# Patient Record
Sex: Female | Born: 2000 | Race: Black or African American | Hispanic: No | Marital: Single | Smoking: Never smoker
Health system: Southern US, Community
[De-identification: ages and names within clinical notes are randomized; demographics above are authoritative.]

## PROBLEM LIST (undated history)

## (undated) DIAGNOSIS — F4310 Post-traumatic stress disorder, unspecified: Secondary | ICD-10-CM

## (undated) DIAGNOSIS — J309 Allergic rhinitis, unspecified: Secondary | ICD-10-CM

## (undated) DIAGNOSIS — L309 Dermatitis, unspecified: Secondary | ICD-10-CM

## (undated) DIAGNOSIS — Z803 Family history of malignant neoplasm of breast: Secondary | ICD-10-CM

## (undated) DIAGNOSIS — G4700 Insomnia, unspecified: Secondary | ICD-10-CM

## (undated) DIAGNOSIS — M8430XA Stress fracture, unspecified site, initial encounter for fracture: Secondary | ICD-10-CM

## (undated) DIAGNOSIS — F32A Depression, unspecified: Secondary | ICD-10-CM

## (undated) DIAGNOSIS — G4730 Sleep apnea, unspecified: Secondary | ICD-10-CM

## (undated) DIAGNOSIS — Z800 Family history of malignant neoplasm of digestive organs: Secondary | ICD-10-CM

## (undated) DIAGNOSIS — F329 Major depressive disorder, single episode, unspecified: Secondary | ICD-10-CM

## (undated) HISTORY — DX: Allergic rhinitis, unspecified: J30.9

## (undated) HISTORY — PX: WISDOM TOOTH EXTRACTION: SHX21

## (undated) HISTORY — DX: Family history of malignant neoplasm of digestive organs: Z80.0

## (undated) HISTORY — DX: Dermatitis, unspecified: L30.9

## (undated) HISTORY — DX: Family history of malignant neoplasm of breast: Z80.3

---
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
## 2018-07-30 ENCOUNTER — Encounter (HOSPITAL_COMMUNITY): Payer: Self-pay | Admitting: *Deleted

## 2018-07-30 ENCOUNTER — Ambulatory Visit (HOSPITAL_COMMUNITY)
Admission: EM | Admit: 2018-07-30 | Discharge: 2018-07-30 | Disposition: A | Discharge status: Home / Self Care | Attending: Family Medicine | Admitting: Family Medicine

## 2018-07-30 ENCOUNTER — Other Ambulatory Visit: Payer: Self-pay

## 2018-07-30 DIAGNOSIS — B349 Viral infection, unspecified: Secondary | ICD-10-CM | POA: Diagnosis not present

## 2018-07-30 HISTORY — DX: Depression, unspecified: F32.A

## 2018-07-30 HISTORY — DX: Insomnia, unspecified: G47.00

## 2018-07-30 HISTORY — DX: Stress fracture, unspecified site, initial encounter for fracture: M84.30XA

## 2018-07-30 HISTORY — DX: Major depressive disorder, single episode, unspecified: F32.9

## 2018-07-30 HISTORY — DX: Post-traumatic stress disorder, unspecified: F43.10

## 2018-07-30 MED ORDER — DICLOFENAC SODIUM 75 MG PO TBEC: 75.0000 mg | DELAYED_RELEASE_TABLET | Freq: Two times a day (BID) | ORAL | 0 refills | Status: DC

## 2018-07-30 MED ORDER — ONDANSETRON 8 MG PO TBDP: 8.0000 mg | ORAL_TABLET | Freq: Three times a day (TID) | ORAL | 0 refills | Status: DC | PRN

## 2018-07-30 NOTE — ED Provider Notes (Signed)
[facility]    CSN: [id number] Arrival date & time: 07/30/18  1722     History   Chief Complaint Chief Complaint  Patient presents with  . Generalized Body Aches  . Nausea    HPI Yvette Caldwell is a 17 y.o. female.   This is the [facility] for this 17 year old woman.  She complains of 4 days of nausea, dizziness, and body aches without fever or vomiting.  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  She flew when several days ago and is going back this coming Saturday.  Patient has a long history of intermittent dizziness.  She is never had a good diagnosis for this.  Patient has no recent vomiting or fever.     Past Medical History:  Diagnosis Date  . Depression   . Insomnia   . PTSD (post-traumatic stress disorder)   . Stress fracture     There are no active problems to display for this patient.   Past Surgical History:  Procedure Laterality Date  . WISDOM TOOTH EXTRACTION      OB History   No obstetric history on file.      Home Medications    Prior to Admission medications   Medication Sig Start Date End Date Taking? Authorizing Provider  Fexofenadine HCl (ALLEGRA PO) Take by mouth.   Yes [provider]  diclofenac (VOLTAREN) 75 MG EC tablet Take 1 tablet (75 mg total) by mouth 2 (two) times daily. 07/30/18   Elvina SidleLauenstein, Justice Aguirre, MD  ondansetron (ZOFRAN-ODT) 8 MG disintegrating tablet Take 1 tablet (8 mg total) by mouth every 8 (eight) hours as needed for nausea. 07/30/18   Elvina SidleLauenstein, Molly Maselli, MD    Family History Family History  Problem Relation Age of Onset  . Pancreatitis Mother     Social History Social History   Tobacco Use  . Smoking status: Never Smoker  . Smokeless tobacco: Never Used  Substance Use Topics  . Alcohol use: Never    Frequency: Never  . Drug use: Not on file     Allergies   Patient has no known allergies.   Review of  Systems Review of Systems   Physical Exam Triage Vital Signs ED Triage Vitals  Enc Vitals Group     BP 07/30/18 1814 123/63     Pulse Rate 07/30/18 1813 78     Resp 07/30/18 1813 18     Temp 07/30/18 1813 98.6 F (37 C)     Temp Source 07/30/18 1813 Oral     SpO2 07/30/18 1813 100 %     Weight --      Height --      Head Circumference --      Peak Flow --      Pain Score 07/30/18 1813 5     Pain Loc --      Pain Edu? --      Excl. in GC? --    No data found.  Updated Vital Signs BP 123/63   Pulse 78   Temp 98.6 F (37 C) (Oral)   Resp 18   LMP 07/28/2018 (Approximate)   SpO2 100%    Physical Exam Vitals signs and nursing note reviewed.  Constitutional:      General: She is not in acute distress.    Appearance: Normal appearance. She is not ill-appearing, toxic-appearing or diaphoretic.  HENT:     Head: Normocephalic  and atraumatic.     Right Ear: Tympanic membrane, ear canal and external ear normal.     Left Ear: Tympanic membrane, ear canal and external ear normal.     Nose: Nose normal.     Mouth/Throat:     Mouth: Mucous membranes are moist.     Pharynx: Oropharynx is clear.  Eyes:     Conjunctiva/sclera: Conjunctivae normal.     Pupils: Pupils are equal, round, and reactive to light.  Neck:     Musculoskeletal: Normal range of motion and neck supple.  Cardiovascular:     Rate and Rhythm: Normal rate.     Heart sounds: Normal heart sounds.  Pulmonary:     Effort: Pulmonary effort is normal.     Breath sounds: Normal breath sounds.  Musculoskeletal: Normal range of motion.  Skin:    General: Skin is warm and dry.  Neurological:     General: No focal deficit present.     Mental Status: She is alert and oriented to person, place, and time.  Psychiatric:        Mood and Affect: Mood normal.        Thought Content: Thought content normal.      UC Treatments / Results  Labs (all labs ordered are listed, but only abnormal results are  displayed) Labs Reviewed - No data to display  EKG None  Radiology No results found.  Procedures Procedures (including critical care time)  Medications Ordered in UC Medications - No data to display  Initial Impression / Assessment and Plan / UC Course  I have reviewed the triage vital signs and the nursing notes.  Pertinent labs & imaging results that were available during my care of the patient were reviewed by me and considered in my medical decision making (see chart for details).    Final Clinical Impressions(s) / UC Diagnoses   Final diagnoses:  Viral illness   Discharge Instructions   None    ED Prescriptions    Medication Sig Dispense Auth. Provider   ondansetron (ZOFRAN-ODT) 8 MG disintegrating tablet Take 1 tablet (8 mg total) by mouth every 8 (eight) hours as needed for nausea. 15 tablet Elvina SidleLauenstein, Zhanae Proffit, MD   diclofenac (VOLTAREN) 75 MG EC tablet Take 1 tablet (75 mg total) by mouth 2 (two) times daily. 14 tablet Elvina SidleLauenstein, Toi Stelly, MD     Controlled Substance Prescriptions [place] Controlled Substance Registry consulted? Not Applicable   Elvina SidleLauenstein, Keeara Frees, MD 07/30/18 [phone]

## 2018-07-30 NOTE — ED Triage Notes (Signed)
C/o generalized body aches, nausea, dizziness x 4 days without fever or vomiting.

## 2020-01-22 ENCOUNTER — Encounter (HOSPITAL_COMMUNITY): Payer: Self-pay

## 2020-01-22 ENCOUNTER — Emergency Department (HOSPITAL_COMMUNITY)
Admission: EM | Admit: 2020-01-22 | Discharge: 2020-01-23 | Disposition: A | Discharge status: Home / Self Care | Payer: Self-pay | Attending: Emergency Medicine | Admitting: Emergency Medicine

## 2020-01-22 DIAGNOSIS — F419 Anxiety disorder, unspecified: Secondary | ICD-10-CM | POA: Insufficient documentation

## 2020-01-22 DIAGNOSIS — Z8616 Personal history of COVID-19: Secondary | ICD-10-CM | POA: Insufficient documentation

## 2020-01-22 DIAGNOSIS — U071 COVID-19: Secondary | ICD-10-CM

## 2020-01-22 DIAGNOSIS — R197 Diarrhea, unspecified: Secondary | ICD-10-CM | POA: Insufficient documentation

## 2020-01-22 NOTE — ED Notes (Signed)
Dinita, mother, [phone] would like a callback when in a room

## 2020-01-22 NOTE — ED Triage Notes (Signed)
Pt states that she has been having an anxiety attack since Sunday, has taken her home meds with out relief. Denies SI/HI/AVH. C/o of some nausea. Covid + on 6/11

## 2020-01-23 ENCOUNTER — Emergency Department (HOSPITAL_COMMUNITY): Payer: Self-pay

## 2020-01-23 LAB — URINALYSIS, ROUTINE W REFLEX MICROSCOPIC
Bacteria, UA: NONE SEEN
Bilirubin Urine: NEGATIVE
Glucose, UA: NEGATIVE mg/dL
Ketones, ur: NEGATIVE mg/dL
Leukocytes,Ua: NEGATIVE
Nitrite: NEGATIVE
Protein, ur: NEGATIVE mg/dL
Specific Gravity, Urine: 1.026 (ref 1.005–1.030)
pH: 5 (ref 5.0–8.0)

## 2020-01-23 LAB — COMPREHENSIVE METABOLIC PANEL
ALT: 60 U/L — ABNORMAL HIGH (ref 0–44)
AST: 67 U/L — ABNORMAL HIGH (ref 15–41)
Albumin: 3.7 g/dL (ref 3.5–5.0)
Alkaline Phosphatase: 69 U/L (ref 38–126)
Anion gap: 11 (ref 5–15)
BUN: 12 mg/dL (ref 6–20)
CO2: 22 mmol/L (ref 22–32)
Calcium: 9.2 mg/dL (ref 8.9–10.3)
Chloride: 106 mmol/L (ref 98–111)
Creatinine, Ser: 0.78 mg/dL (ref 0.44–1.00)
GFR calc Af Amer: >60 mL/min (ref >60)
GFR calc non Af Amer: >60 mL/min (ref >60)
Glucose, Bld: 93 mg/dL (ref 70–99)
Potassium: 3.9 mmol/L (ref 3.5–5.1)
Sodium: 139 mmol/L (ref 135–145)
Total Bilirubin: 0.5 mg/dL (ref 0.3–1.2)
Total Protein: 7.4 g/dL (ref 6.5–8.1)

## 2020-01-23 LAB — CBC WITH DIFFERENTIAL/PLATELET
Abs Immature Granulocytes: 0.01 10*3/uL (ref 0.00–0.07)
Basophils Absolute: 0 10*3/uL (ref 0.0–0.1)
Basophils Relative: 0 %
Eosinophils Absolute: 0 10*3/uL (ref 0.0–0.5)
Eosinophils Relative: 1 %
HCT: 42.7 % (ref 36.0–46.0)
Hemoglobin: 13.9 g/dL (ref 12.0–15.0)
Immature Granulocytes: 0 %
Lymphocytes Relative: 68 %
Lymphs Abs: 2.9 10*3/uL (ref 0.7–4.0)
MCH: 28 pg (ref 26.0–34.0)
MCHC: 32.6 g/dL (ref 30.0–36.0)
MCV: 86.1 fL (ref 80.0–100.0)
Monocytes Absolute: 0.3 10*3/uL (ref 0.1–1.0)
Monocytes Relative: 8 %
Neutro Abs: 1 10*3/uL — ABNORMAL LOW (ref 1.7–7.7)
Neutrophils Relative %: 23 %
Platelets: 127 10*3/uL — ABNORMAL LOW (ref 150–400)
RBC: 4.96 MIL/uL (ref 3.87–5.11)
RDW: 13.2 % (ref 11.5–15.5)
WBC: 4.3 10*3/uL (ref 4.0–10.5)
nRBC: 0 % (ref 0.0–0.2)

## 2020-01-23 LAB — LIPASE, BLOOD: Lipase: 27 U/L (ref 11–51)

## 2020-01-23 LAB — I-STAT BETA HCG BLOOD, ED (MC, WL, AP ONLY): I-stat hCG, quantitative: <5 m[IU]/mL (ref <5)

## 2020-01-23 LAB — TROPONIN I (HIGH SENSITIVITY): Troponin I (High Sensitivity): <2 ng/L (ref <18)

## 2020-01-23 MED ORDER — LORAZEPAM 2 MG/ML IJ SOLN
1.0000 mg | Freq: Once | INTRAMUSCULAR | Status: AC
  Administered 2020-01-23: 1 mg via INTRAVENOUS
  Filled 2020-01-23: qty 1

## 2020-01-23 MED ORDER — ONDANSETRON 4 MG PO TBDP
4.0000 mg | ORAL_TABLET | Freq: Three times a day (TID) | ORAL | 0 refills | Status: AC | PRN
Start: 2020-01-23 — End: ?

## 2020-01-23 MED ORDER — SODIUM CHLORIDE 0.9 % IV BOLUS
1000.0000 mL | Freq: Once | INTRAVENOUS | Status: AC
  Administered 2020-01-23: 1000 mL via INTRAVENOUS

## 2020-01-23 NOTE — Discharge Instructions (Addendum)
At this time there does not appear to be the presence of an emergent medical condition, however there is always the potential for conditions to change. Please read and follow the below instructions.  Please return to the Emergency Department immediately for any new or worsening symptoms or if your symptoms do not improve within 3 days. Please be sure to follow up with your Primary Care Provider within one week regarding your visit today; please call their office to schedule an appointment even if you are feeling better for a follow-up visit. You may use the nausea medication Zofran as prescribed to help with nausea. Zofran will dissolve under your tongue so you do not have to swallow it. Please be sure to drink plenty of water to avoid dehydration and get plenty of rest. Your liver function tests, ALT and AST were slightly elevated today. Have your laboratory tests rechecked by your primary care doctor next week.  Get help right away if: You have chest pain. You feel very weak or you pass out (faint). You have bloody or black poop or poop that looks like tar. You have very bad pain, cramping, or bloating in your belly (abdomen). You have trouble breathing or you are breathing very quickly. Your heart is beating very quickly. Your skin feels cold and clammy. You feel confused. You have signs of losing too much water in your body, such as: Dark pee, very little pee, or no pee. Cracked lips. Dry mouth. Sunken eyes. Sleepiness. Weakness. You have trouble breathing. You have pain or pressure in your chest. You have confusion. You have bluish lips and fingernails. You have difficulty waking from sleep. You have any new/concerning or worsening of symptoms  If you ever feel like you may hurt yourself or others, or have thoughts about taking your own life, get help right away. You can go to your nearest emergency department or call: Your local emergency services (911 in the [place] A suicide  crisis helpline, such as the National Suicide Prevention Lifeline at (858)128-0572. This is open 24 hours a day.  Please read the additional information packets attached to your discharge summary.  Do not take your medicine if  develop an itchy rash, swelling in your mouth or lips, or difficulty breathing; call 911 and seek immediate emergency medical attention if this occurs.  Note: Portions of this text may have been transcribed using voice recognition software. Every effort was made to ensure accuracy; however, inadvertent computerized transcription errors may still be present.

## 2020-01-23 NOTE — ED Provider Notes (Signed)
[facility] EMERGENCY DEPARTMENT Provider Note   CSN: [id number] Arrival date & time: 01/22/20  2013     History Chief Complaint  Patient presents with  . Panic Attack    Yvette Caldwell is a 19 y.o. female history of PTSD, anxiety/depression.  Patient reports of the last 3 days she has been experiencing a GI illness which she describes as "the stomach bug". She reports intermittent abdominal cramping associated with nonbloody diarrhea multiple times a day for the past 3 days. Abdominal cramping sensation is only when she has diarrhea improves after she stops using the bathroom, generalized mild intermittent. Patient feels that her abdominal cramping shot up to her chest briefly 1 time yesterday and has not reoccurred. Associated with nausea.  Patient reports that her diarrheal illness has caused her to have increased anxiety over the past few days. She has been taking her hydroxyzine at home with minimal relief. She also takes Zoloft in the morning without relief. Patient reports that hospitals are a "trigger" for her and coming in today has caused her increased anxiety.  Of note patient reports she was diagnosed with COVID-19 viral infection on January 11, 2020.  She denies fever/chills, headache, neck pain, sore throat, shortness of breath, cough/hemoptysis, vomiting, dysuria/hematuria, vaginal discharge/concern for STI, fall/injury, extremity swelling/color change, drug/alcohol use, SI/HI, hallucinations or any additional concerns.  HPI     Past Medical History:  Diagnosis Date  . Depression   . Insomnia   . PTSD (post-traumatic stress disorder)   . Stress fracture     There are no problems to display for this patient.   Past Surgical History:  Procedure Laterality Date  . WISDOM TOOTH EXTRACTION       OB History   No obstetric history on file.     Family History  Problem Relation Age of Onset  . Pancreatitis Mother     Social History    Tobacco Use  . Smoking status: Never Smoker  . Smokeless tobacco: Never Used  Vaping Use  . Vaping Use: Never used  Substance Use Topics  . Alcohol use: Never  . Drug use: Not on file    Home Medications Prior to Admission medications   Medication Sig Start Date End Date Taking? Authorizing Provider  diclofenac (VOLTAREN) 75 MG EC tablet Take 1 tablet (75 mg total) by mouth 2 (two) times daily. 07/30/18   Robyn Haber, MD  Fexofenadine HCl (ALLEGRA PO) Take by mouth.    [provider]  ondansetron (ZOFRAN-ODT) 8 MG disintegrating tablet Take 1 tablet (8 mg total) by mouth every 8 (eight) hours as needed for nausea. 07/30/18   Robyn Haber, MD    Allergies    Patient has no known allergies.  Review of Systems   Review of Systems Ten systems are reviewed and are negative for acute change except as noted in the HPI   Physical Exam Updated Vital Signs BP 126/77 (BP Location: Left Arm)   Pulse 98   Temp 98.2 F (36.8 C) (Oral)   Resp 16   Ht 5\' 8"  (1.727 m)   Wt 70.3 kg   LMP 01/01/2020   SpO2 98%   BMI 23.57 kg/m   Physical Exam Constitutional:      General: She is not in acute distress.    Appearance: Normal appearance. She is well-developed. She is not ill-appearing or diaphoretic.  HENT:     Head: Normocephalic and atraumatic.  Eyes:     General: Vision grossly  intact. Gaze aligned appropriately.     Pupils: Pupils are equal, round, and reactive to light.  Neck:     Trachea: Trachea and phonation normal.  Cardiovascular:     Rate and Rhythm: Normal rate and regular rhythm.  Pulmonary:     Effort: Pulmonary effort is normal. No respiratory distress.  Abdominal:     General: There is no distension.     Palpations: Abdomen is soft.     Tenderness: There is no abdominal tenderness. There is no guarding or rebound.  Genitourinary:    Comments: Pelvic examination refused by patient Musculoskeletal:        General: Normal range of motion.      Cervical back: Normal range of motion.  Skin:    General: Skin is warm and dry.  Neurological:     Mental Status: She is alert.     GCS: GCS eye subscore is 4. GCS verbal subscore is 5. GCS motor subscore is 6.     Comments: Speech is clear and goal oriented, follows commands Major Cranial nerves without deficit, no facial droop Moves extremities without ataxia, coordination intact  Psychiatric:        Behavior: Behavior normal.     ED Results / Procedures / Treatments   Labs (all labs ordered are listed, but only abnormal results are displayed) Labs Reviewed  CBC WITH DIFFERENTIAL/PLATELET  COMPREHENSIVE METABOLIC PANEL  LIPASE, BLOOD  URINALYSIS, ROUTINE W REFLEX MICROSCOPIC  I-STAT BETA HCG BLOOD, ED ([facility], [facility], AP ONLY)  TROPONIN I (HIGH SENSITIVITY)    EKG EKG Interpretation  Date/Time:  Wednesday January 23 2020 02:16:17 EDT Ventricular Rate:  69 PR Interval:    QRS Duration: 74 QT Interval:  368 QTC Calculation: 395 R Axis:   66 Text Interpretation: Sinus rhythm RSR' in V1 or V2, probably normal variant Confirmed by Ross Marcus ([id number]) on 01/23/2020 4:29:55 AM   Radiology No results found.  Procedures Procedures (including critical care time)  Medications Ordered in ED Medications  LORazepam (ATIVAN) injection 1 mg (has no administration in time range)  sodium chloride 0.9 % bolus 1,000 mL (has no administration in time range)    ED Course  I have reviewed the triage vital signs and the nursing notes.  Pertinent labs & imaging results that were available during my care of the patient were reviewed by me and considered in my medical decision making (see chart for details).  Yvette Caldwell was evaluated in Emergency Department on 01/23/2020 for the symptoms described in the history of present illness. She was evaluated in the context of the global COVID-19 pandemic, which necessitated consideration that the patient might be at risk for infection with the  SARS-CoV-2 virus that causes COVID-19. Institutional protocols and algorithms that pertain to the evaluation of patients at risk for COVID-19 are in a state of rapid change based on information released by regulatory bodies including the CDC and federal and state organizations. These policies and algorithms were followed during the patient's care in the ED.    MDM Rules/Calculators/A&P                          Additional History Obtained: 1. Nursing notes from this visit. 2. EMR system reviewed patient had positive Covid test on 01/15/2020 at [facility]. -----  I ordered, reviewed and interpreted labs which include: CBC shows no leukocytosis to suggest infection and no evidence of anemia. Lipase within normal limits doubt pancreatitis. CMP shows  mild elevation of AST and ALT at 67 and 68 respectively.  No emergent electrolyte derangement, evidence of acute kidney injury or gap. Pregnancy test negative. High-sensitivity troponin negative, with brief episode of pain yesterday no indication for a delta troponin. Urinalysis shows hemoglobin consistent with active patient's menstrual cycle, no evidence of infection.  EKG: Sinus rhythm RSR' in V1 or V2, probably normal variant Confirmed by Ross Marcus ([id number]) on 01/23/2020 4:29:55 AM   Chest x-ray:  IMPRESSION:  No acute chest findings. Other than mild scoliosis, negative  portable AP view of the chest.  I personally reviewed patient's chest x-ray and agree with radiologist interpretation above. - Patient received 1 L normal saline fluid bolus and 1 mg Ativan.  Anxiety resolved.  Patient has no SI/HI hallucinations no indication for TTS evaluation.  She has home anxiety medications that she will continue taking and see her primary care provider.  Patient's brief episode of chest pain yesterday in the setting of an anxiety, suspect this as etiology of her symptoms.  Doubt ACS, PE, dissection or other emergent cardiopulmonary etiology of her symptoms  yesterday.  GI symptoms are likely secondary to her gastroenteritis suspect this to be viral in nature possibly due to known COVID-19 infection.  No indication for antibiotics at this time.  On reevaluation her abdomen is soft and nontender, doubt appendicitis, SBO, cholecystitis or other emergent pathologies, do not feel any indication for imaging of the abdomen/pelvis is indicated at this time.  Will encourage continued p.o. intake and give ODT Zofran for nausea control.  Patient will see her PCP for follow-up. Patient is well-appearing no acute distress vital signs stable and requesting discharge.  At this time there does not appear to be any evidence of an acute emergency medical condition and the patient appears stable for discharge with appropriate outpatient follow up. Diagnosis was discussed with patient who verbalizes understanding of care plan and is agreeable to discharge. I have discussed return precautions with patient who verbalizes understanding. Patient encouraged to follow-up with their PCP. All questions answered.  Patient's case discussed with Dr. Wilkie Aye who agrees with plan to discharge with follow-up.   Note: Portions of this report may have been transcribed using voice recognition software. Every effort was made to ensure accuracy; however, inadvertent computerized transcription errors may still be present. Final Clinical Impression(s) / ED Diagnoses Final diagnoses:  Diarrhea, unspecified type  COVID-19  Anxiety    Rx / DC Orders ED Discharge Orders         Ordered    ondansetron (ZOFRAN ODT) 4 MG disintegrating tablet  Every 8 hours PRN     Discontinue  Reprint     01/23/20 0457           Bill Salinas, PA-C 01/23/20 0459    Shon Baton, MD 01/23/20 [phone]

## 2021-03-20 IMAGING — DX DG CHEST 1V PORT
1 series · 1 of 1 positions shown · non-contrast
Comparison: Radiograph 04/29/2016

CLINICAL DATA: Chest pain.

EXAM:
PORTABLE CHEST 1 VIEW

[chest ap]
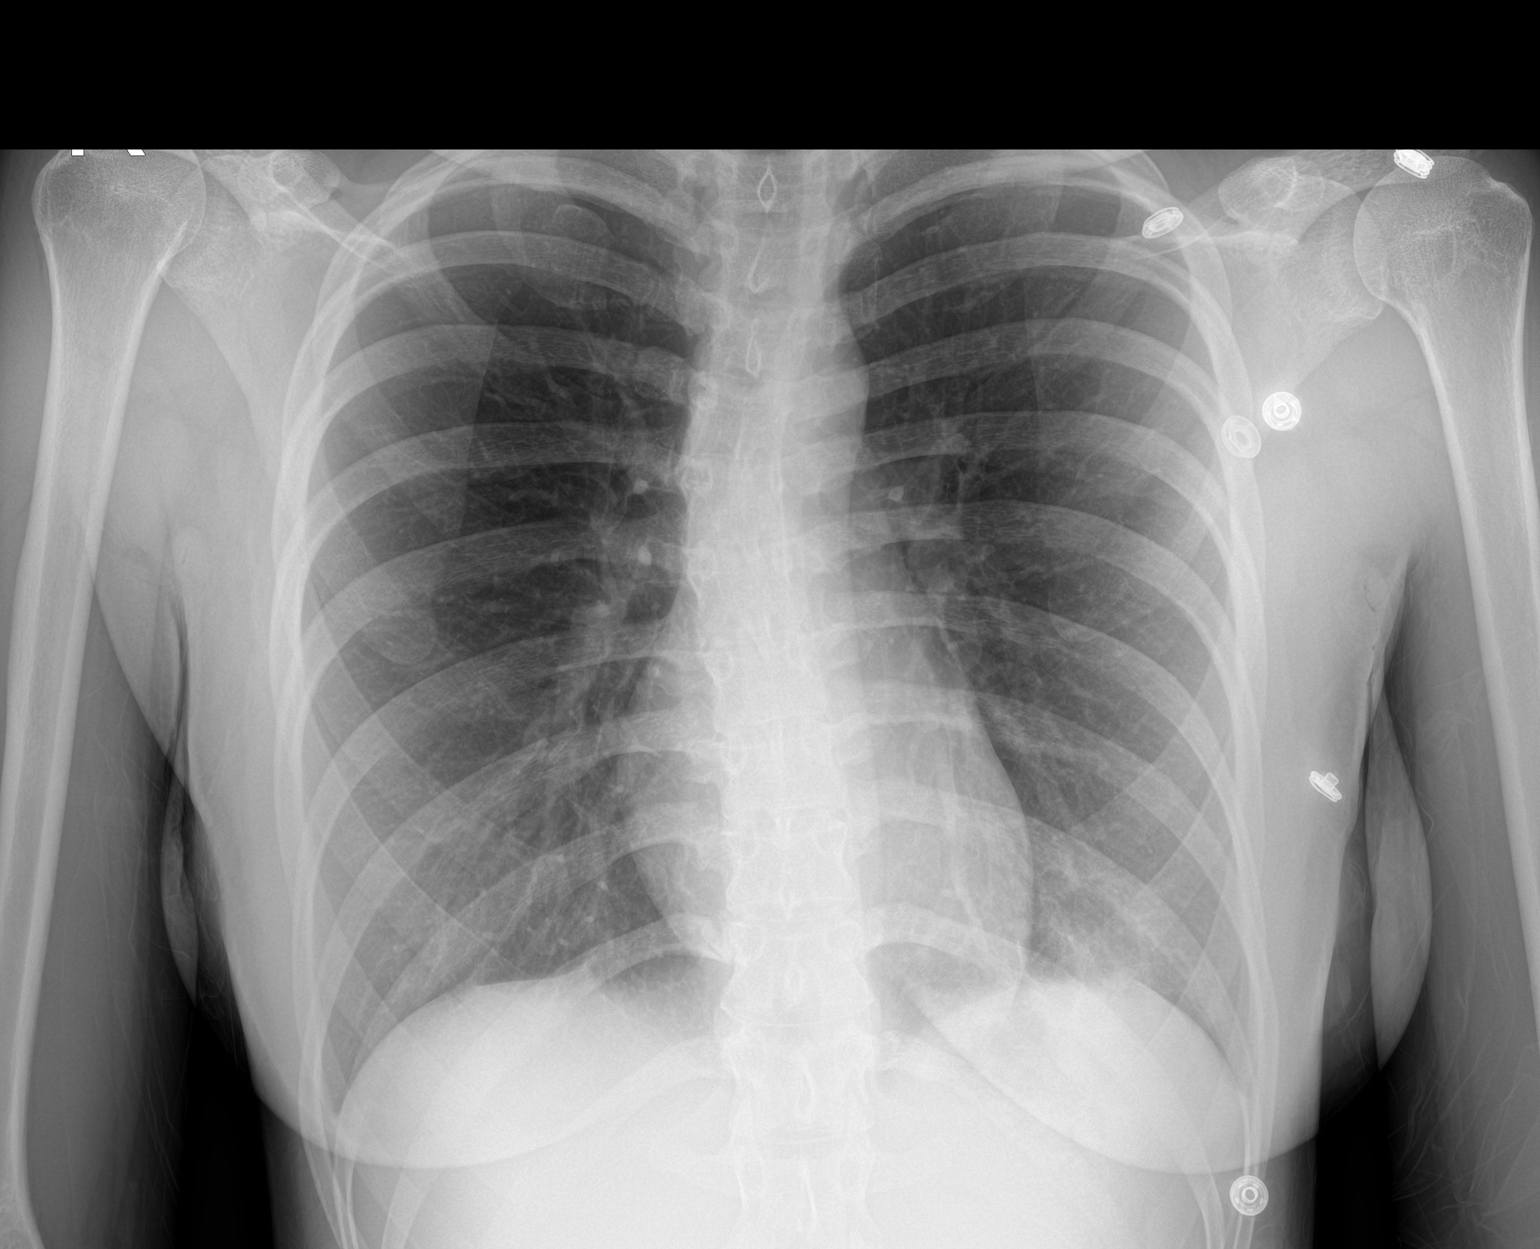

[1 of 1 positions shown; findings below may reference images not displayed]

FINDINGS: The cardiomediastinal contours are normal. The lungs are clear.
Pulmonary vasculature is normal. No consolidation, pleural effusion,
or pneumothorax. Similar mild scoliotic curvature of the upper
thoracic spine. No acute osseous abnormalities are seen.
IMPRESSION: No acute chest findings. Other than mild scoliosis, negative
portable AP view of the chest.

## 2021-12-14 ENCOUNTER — Telehealth: Payer: Self-pay | Admitting: Genetic Counselor

## 2021-12-14 NOTE — Telephone Encounter (Signed)
Scheduled appt per 5/15 referral. Pt is aware of appt date and time. Pt is aware to arrive 15 mins prior to appt time and to bring and updated insurance card. Pt is aware of appt location.   

## 2022-01-11 ENCOUNTER — Other Ambulatory Visit: Payer: Self-pay | Admitting: Genetic Counselor

## 2022-01-11 DIAGNOSIS — Z803 Family history of malignant neoplasm of breast: Secondary | ICD-10-CM

## 2022-01-13 ENCOUNTER — Inpatient Hospital Stay: Payer: Self-pay

## 2022-01-13 ENCOUNTER — Other Ambulatory Visit: Payer: Self-pay

## 2022-01-13 ENCOUNTER — Inpatient Hospital Stay: Payer: Self-pay | Attending: Genetic Counselor | Admitting: Genetic Counselor

## 2022-01-13 DIAGNOSIS — Z800 Family history of malignant neoplasm of digestive organs: Secondary | ICD-10-CM

## 2022-01-13 DIAGNOSIS — Z803 Family history of malignant neoplasm of breast: Secondary | ICD-10-CM

## 2022-01-13 LAB — GENETIC SCREENING ORDER

## 2022-01-14 ENCOUNTER — Encounter: Payer: Self-pay | Admitting: Genetic Counselor

## 2022-01-14 DIAGNOSIS — Z800 Family history of malignant neoplasm of digestive organs: Secondary | ICD-10-CM | POA: Insufficient documentation

## 2022-01-14 DIAGNOSIS — Z803 Family history of malignant neoplasm of breast: Secondary | ICD-10-CM | POA: Insufficient documentation

## 2022-01-14 NOTE — Progress Notes (Signed)
REFERRING PROVIDER: [facility], [address],  [place] [address]  PRIMARY PROVIDER:  [facility], [facility]  PRIMARY REASON FOR VISIT:  1. Family history of breast cancer   2. Family history of colon cancer      HISTORY OF PRESENT ILLNESS:   Yvette Caldwell, a 21 y.o. female, was seen for a [place] cancer genetics consultation at the request of the [facility] due to a family history of cancer.  Yvette Caldwell presents to clinic today to discuss the possibility of a hereditary predisposition to cancer, genetic testing, and to further clarify her future cancer risks, as well as potential cancer risks for family members.   Yvette Caldwell is a 21 y.o. female with no personal history of cancer.  She has a family history of breast, colon and prostate cancer  CANCER HISTORY:  Oncology History   No history exists.     RISK FACTORS:  Menarche was at age 67.  First live birth at age N/A.  OCP use for approximately 0 years.  Ovaries intact: yes.  Hysterectomy: no.  Menopausal status: premenopausal.  HRT use: 0 years. Colonoscopy: no; not examined. Mammogram within the last year: no. Number of breast biopsies: 0. Up to date with pelvic exams: yes. Any excessive radiation exposure in the past: no  Past Medical History:  Diagnosis Date   Depression    Family history of breast cancer    Family history of breast cancer    Family history of colon cancer    Insomnia    PTSD (post-traumatic stress disorder)    Stress fracture     Past Surgical History:  Procedure Laterality Date   WISDOM TOOTH EXTRACTION      Social History   Socioeconomic History   Marital status: Single    Spouse name: Not on file   Number of children: Not on file   Years of education: Not on file   Highest education level: Not on file  Occupational History   Not on file  Tobacco Use   Smoking status: Never   Smokeless tobacco: Never  Vaping Use   Vaping Use: Never used   Substance and Sexual Activity   Alcohol use: Never   Drug use: Not on file   Sexual activity: Not Currently  Other Topics Concern   Not on file  Social History Narrative   Not on file   Social Determinants of Health   Financial Resource Strain: Not on file  Food Insecurity: Not on file  Transportation Needs: Not on file  Physical Activity: Not on file  Stress: Not on file  Social Connections: Not on file     FAMILY HISTORY:  We obtained a detailed, 4-generation family history.  Significant diagnoses are listed below: Family History  Problem Relation Age of Onset   Pancreatitis Mother    Prolactinoma Mother    Cancer Paternal Aunt        NOS   Colon cancer Maternal Grandmother 106   Cancer Paternal Grandmother        NOS   Breast cancer Other 53   Prolactinoma Other    Prostate cancer Other        MGFs brother   Colon cancer Maternal Great-grandmother        MGF's mother      The patient does not have children.  She has two maternal half sisters who are cancer free.  She has three paternal half brothers and a half sister who are cancer free.  Both parents are living.  The patient's mother has a prolactinoma.  She has two sister and three brothers all who were cancer free. Her mother had colon cancer at 31 and she has a sister who had breast cancer in her 42's.  Her father is cancer free, but had a sister with a prolactinoma, a brother with prostate cancer and a mother with colon cancer.  The patient's father is cancer free. He has a sister with an unknown cancer, his mother died of un specified cancer and his father's brother had cancer.  Yvette Caldwell is unaware of previous family history of genetic testing for hereditary cancer risks. Patient's maternal ancestors are of African American descent, and paternal ancestors are of African American descent. There is no reported Ashkenazi Jewish ancestry. There is no known consanguinity.  GENETIC COUNSELING ASSESSMENT: [name] is a 21 y.o. female with a family history of cancer which is somewhat suggestive of a hereditary cancer syndrome and predisposition to cancer given the young age of onset of her great aunt, but also the family history of prolactinoma. We, therefore, discussed and recommended the following at today's visit.   DISCUSSION: We discussed that, in general, most cancer is not inherited in families, but instead is sporadic or familial. Sporadic cancers occur by chance and typically happen at older ages (>50 years) as this type of cancer is caused by genetic changes acquired during an individual's lifetime. Some families have more cancers than would be expected by chance; however, the ages or types of cancer are not consistent with a known genetic mutation or known genetic mutations have been ruled out. This type of familial cancer is thought to be due to a combination of multiple genetic, environmental, hormonal, and lifestyle factors. While this combination of factors likely increases the risk of cancer, the exact source of this risk is not currently identifiable or testable.  We discussed that 5 - 10% of breast cancer is hereditary, with most cases associated with BRCA mutations.  There are other genes that can be associated with hereditary breast cancer syndromes.  These include ATM, CHEK2 and PALB2.  We discussed that testing is beneficial for several reasons including knowing how to follow individuals after completing their treatment, identifying whether potential treatment options such as PARP inhibitors would be beneficial, and understand if other family members could be at risk for cancer and allow them to undergo genetic testing.   There is a family history of prolactinoma in the patient's mother and maternal great aunt.  Prolactinoma's are tumors of the pituitary gland and can affect men or women.  Many occur by chance, but some can be associated with multiple endocrine neoplasia, type 1  (MEN1).  We discussed that some people do not want to undergo genetic testing due to fear of genetic discrimination.  A federal law called the Genetic Information Non-Discrimination Act (GINA) of 2008 helps protect individuals against genetic discrimination based on their genetic test results.  It impacts both health insurance and employment.  With health insurance, it protects against increased premiums, being kicked off insurance or being forced to take a test in order to be insured.  For employment it protects against hiring, firing and promoting decisions based on genetic test results.  Health status due to a cancer diagnosis is not protected under GINA.   We reviewed the characteristics, features and inheritance patterns of hereditary cancer syndromes. We also discussed genetic testing, including the appropriate family members to test, the process of testing,  insurance coverage and turn-around-time for results. We discussed the implications of a negative, positive, carrier and/or variant of uncertain significant result. We recommended Yvette Caldwell pursue genetic testing for the CancerNext-Expanded+RNAinsight gene panel.   The CancerNext-Expanded gene panel offered by [facility] and includes sequencing and rearrangement analysis for the following 77 genes: AIP, ALK, APC*, ATM*, AXIN2, BAP1, BARD1, BLM, BMPR1A, BRCA1*, BRCA2*, BRIP1*, CDC73, CDH1*, CDK4, CDKN1B, CDKN2A, CHEK2*, CTNNA1, DICER1, FANCC, FH, FLCN, GALNT12, KIF1B, LZTR1, MAX, MEN1, MET, MLH1*, MSH2*, MSH3, MSH6*, MUTYH*, NBN, NF1*, NF2, NTHL1, PALB2*, PHOX2B, PMS2*, POT1, PRKAR1A, PTCH1, PTEN*, RAD51C*, RAD51D*, RB1, RECQL, RET, SDHA, SDHAF2, SDHB, SDHC, SDHD, SMAD4, SMARCA4, SMARCB1, SMARCE1, STK11, SUFU, TMEM127, TP53*, TSC1, TSC2, VHL and XRCC2 (sequencing and deletion/duplication); EGFR, EGLN1, HOXB13, KIT, MITF, PDGFRA, POLD1, and POLE (sequencing only); EPCAM and GREM1 (deletion/duplication only). DNA and RNA analyses performed for *  genes.   Based on Yvette Caldwell's family history of cancer, she meets medical criteria for genetic testing. Despite that she meets criteria, she may still have an out of pocket cost. We discussed that if her out of pocket cost for testing is over $100, the laboratory will call and confirm whether she wants to proceed with testing.  If the out of pocket cost of testing is less than $100 she will be billed by the genetic testing laboratory.   PLAN: After considering the risks, benefits, and limitations, Yvette Caldwell provided informed consent to pursue genetic testing and the blood sample was sent to [facility] for analysis of the CancerNext-Expanded+RNAinsight. Results should be available within approximately 2-3 weeks' time, at which point they will be disclosed by telephone to Yvette Caldwell, as will any additional recommendations warranted by these results. Yvette Caldwell will receive a summary of her genetic counseling visit and a copy of her results once available. This information will also be available in Epic.   Lastly, we encouraged Yvette Caldwell to remain in contact with cancer genetics annually so that we can continuously update the family history and inform her of any changes in cancer genetics and testing that may be of benefit for this family.   Yvette Caldwell questions were answered to her satisfaction today. Our contact information was provided should additional questions or concerns arise. Thank you for the referral and allowing [place] to share in the care of your patient.   Bryleigh Ottaway P. Florene Glen, [place], [facility] Licensed, Insurance risk surveyor [email or web address] phone: [phone]  The patient was seen for a total of 40 minutes in face-to-face genetic counseling.  The patient brought her husband. This patient was discussed with Drs. Magrinat, Lindi Adie and/or Burr Medico who agrees with the above.    _______________________________________________________________________ For Office  Staff:  Number of people involved in session: 2 Was an Intern/ student involved with case: no

## 2022-02-05 ENCOUNTER — Telehealth: Payer: Self-pay | Admitting: Genetic Counselor

## 2022-02-05 ENCOUNTER — Encounter: Payer: Self-pay | Admitting: Genetic Counselor

## 2022-02-05 ENCOUNTER — Ambulatory Visit: Payer: Self-pay | Admitting: Genetic Counselor

## 2022-02-05 DIAGNOSIS — Z1379 Encounter for other screening for genetic and chromosomal anomalies: Secondary | ICD-10-CM | POA: Insufficient documentation

## 2022-02-05 NOTE — Telephone Encounter (Signed)
Revealed negative genetic testing.  Discussed that we do not know why there is cancer in the family. It could be due to a different gene that we are not testing, or maybe our current technology may not be able to pick something up.  It will be important for her to keep in contact with genetics to keep up with whether additional testing may be needed.  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

## 2022-02-05 NOTE — Progress Notes (Signed)
HPI:  Ms. Sneed was previously seen in the [facility] due to a family history of cancer and concerns regarding a hereditary predisposition to cancer. Please refer to our prior cancer genetics clinic note for more information regarding our discussion, assessment and recommendations, at the time. Ms. Hollingworth recent genetic test results were disclosed to her, as were recommendations warranted by these results. These results and recommendations are discussed in more detail below.  CANCER HISTORY:  Oncology History   No history exists.    FAMILY HISTORY:  We obtained a detailed, 4-generation family history.  Significant diagnoses are listed below: Family History  Problem Relation Age of Onset   Pancreatitis Mother    Prolactinoma Mother    Cancer Paternal Aunt        NOS   Colon cancer Maternal Grandmother 60   Cancer Paternal Grandmother        NOS   Breast cancer Other 21   Prolactinoma Other    Prostate cancer Other        MGFs brother   Colon cancer Maternal Great-grandmother        MGF's mother       The patient does not have children.  She has two maternal half sisters who are cancer free.  She has three paternal half brothers and a half sister who are cancer free. Both parents are living.   The patient's mother has a prolactinoma.  She has two sister and three brothers all who were cancer free. Her mother had colon cancer at 90 and she has a sister who had breast cancer in her 59's.  Her father is cancer free, but had a sister with a prolactinoma, a brother with prostate cancer and a mother with colon cancer.   The patient's father is cancer free. He has a sister with an unknown cancer, his mother died of un specified cancer and his father's brother had cancer.   Ms. Braatz is unaware of previous family history of genetic testing for hereditary cancer risks. Patient's maternal ancestors are of African American descent, and paternal ancestors are of  African American descent. There is no reported Ashkenazi Jewish ancestry. There is no known consanguinity.  GENETIC TEST RESULTS: Genetic testing reported out on February 03, 2022 through the CancerNext-Expanded+RNAinsight cancer panel found no pathogenic mutations. The CancerNext-Expanded gene panel offered by [facility] and includes sequencing and rearrangement analysis for the following 77 genes: AIP, ALK, APC*, ATM*, AXIN2, BAP1, BARD1, BLM, BMPR1A, BRCA1*, BRCA2*, BRIP1*, CDC73, CDH1*, CDK4, CDKN1B, CDKN2A, CHEK2*, CTNNA1, DICER1, FANCC, FH, FLCN, GALNT12, KIF1B, LZTR1, MAX, MEN1, MET, MLH1*, MSH2*, MSH3, MSH6*, MUTYH*, NBN, NF1*, NF2, NTHL1, PALB2*, PHOX2B, PMS2*, POT1, PRKAR1A, PTCH1, PTEN*, RAD51C*, RAD51D*, RB1, RECQL, RET, SDHA, SDHAF2, SDHB, SDHC, SDHD, SMAD4, SMARCA4, SMARCB1, SMARCE1, STK11, SUFU, TMEM127, TP53*, TSC1, TSC2, VHL and XRCC2 (sequencing and deletion/duplication); EGFR, EGLN1, HOXB13, KIT, MITF, PDGFRA, POLD1, and POLE (sequencing only); EPCAM and GREM1 (deletion/duplication only). DNA and RNA analyses performed for * genes. The test report has been scanned into EPIC and is located under the Molecular Pathology section of the Results Review tab.  A portion of the result report is included below for reference.     We discussed with Ms. Bishara that because current genetic testing is not perfect, it is possible there may be a gene mutation in one of these genes that current testing cannot detect, but that chance is small.  We also discussed, that there could be another gene that has  not yet been discovered, or that we have not yet tested, that is responsible for the cancer diagnoses in the family. It is also possible there is a hereditary cause for the cancer in the family that Ms. Giel did not inherit and therefore was not identified in her testing.  Therefore, it is important to remain in touch with cancer genetics in the future so that we can continue to offer Ms. Mills the  most up to date genetic testing.   Genetic testing did identify a variant of uncertain significance (VUS) was identified in the AXIN2 gene called [id number].  At this time, it is unknown if this variant is associated with increased cancer risk or if this is a normal finding, but most variants such as this get reclassified to being inconsequential. It should not be used to make medical management decisions. With time, we suspect the lab will determine the significance of this variant, if any. If we do learn more about it, we will try to contact Ms. Nell to discuss it further. However, it is important to stay in touch with [place] periodically and keep the address and phone number up to date.  ADDITIONAL GENETIC TESTING: We discussed with Ms. Norfolk that her genetic testing was fairly extensive.  If there are genes identified to increase cancer risk that can be analyzed in the future, we would be happy to discuss and coordinate this testing at that time.    CANCER SCREENING RECOMMENDATIONS: Ms. Hewes test result is considered negative (normal).  This means that we have not identified a hereditary cause for her family history of breast and colon cancer at this time. Most cancers happen by chance and this negative test suggests that her cancer may fall into this category.    While reassuring, this does not definitively rule out a hereditary predisposition to cancer. It is still possible that there could be genetic mutations that are undetectable by current technology. There could be genetic mutations in genes that have not been tested or identified to increase cancer risk.  Therefore, it is recommended she continue to follow the cancer management and screening guidelines provided by her primary healthcare provider.   An individual's cancer risk and medical management are not determined by genetic test results alone. Overall cancer risk assessment incorporates additional factors, including personal medical  history, family history, and any available genetic information that may result in a personalized plan for cancer prevention and surveillance  RECOMMENDATIONS FOR FAMILY MEMBERS:  Individuals in this family might be at some increased risk of developing cancer, over the general population risk, simply due to the family history of cancer.  We recommended women in this family have a yearly mammogram beginning at age 30, or 54 years younger than the earliest onset of cancer, an annual clinical breast exam, and perform monthly breast self-exams. Women in this family should also have a gynecological exam as recommended by their primary provider. All family members should be referred for colonoscopy starting at age 72.  FOLLOW-UP: Lastly, we discussed with Ms. Holecek that cancer genetics is a rapidly advancing field and it is possible that new genetic tests will be appropriate for her and/or her family members in the future. We encouraged her to remain in contact with cancer genetics on an annual basis so we can update her personal and family histories and let her know of advances in cancer genetics that may benefit this family.   Our contact number was provided. Ms. Mavis's questions were answered  to her satisfaction, and she knows she is welcome to call us at anytime with additional questions or concerns.   Roma Kayser, [place], [facility] Licensed, Certified Genetic Counselor [email or web address]

## 2022-04-30 ENCOUNTER — Encounter: Payer: Self-pay | Admitting: Internal Medicine

## 2022-04-30 ENCOUNTER — Ambulatory Visit (INDEPENDENT_AMBULATORY_CARE_PROVIDER_SITE_OTHER): Payer: No Typology Code available for payment source | Admitting: Internal Medicine

## 2022-04-30 VITALS — BP 110/70 | HR 102 | Temp 98.0°F | Resp 16 | Ht 68.0 in | Wt 169.4 lb

## 2022-04-30 DIAGNOSIS — J3089 Other allergic rhinitis: Secondary | ICD-10-CM

## 2022-04-30 DIAGNOSIS — T781XXA Other adverse food reactions, not elsewhere classified, initial encounter: Secondary | ICD-10-CM

## 2022-04-30 DIAGNOSIS — J45991 Cough variant asthma: Secondary | ICD-10-CM

## 2022-04-30 DIAGNOSIS — H1013 Acute atopic conjunctivitis, bilateral: Secondary | ICD-10-CM

## 2022-04-30 DIAGNOSIS — J302 Other seasonal allergic rhinitis: Secondary | ICD-10-CM

## 2022-04-30 MED ORDER — FEXOFENADINE HCL 180 MG PO TABS: 180.0000 mg | ORAL_TABLET | Freq: Every day | ORAL | 5 refills | Status: DC

## 2022-04-30 MED ORDER — EPINEPHRINE 0.3 MG/0.3ML IJ SOAJ: 0.3000 mg | INTRAMUSCULAR | 1 refills | Status: DC | PRN

## 2022-04-30 MED ORDER — FLUTICASONE PROPIONATE 50 MCG/ACT NA SUSP: 2.0000 | Freq: Every day | NASAL | 5 refills | Status: DC

## 2022-04-30 MED ORDER — AZELASTINE HCL 0.1 % NA SOLN: 2.0000 | Freq: Two times a day (BID) | NASAL | 5 refills | Status: DC

## 2022-04-30 NOTE — Patient Instructions (Addendum)
Rhinitis: - Avoidance measures discussed. - Use nasal saline rinses before nose sprays such as with Neilmed Sinus Rinse.  Use distilled water.   - Use Flonase 2 sprays each nostril daily. Aim upward and outward. - Use Azelastine 2 sprays each nostril twice daily. Aim upward and outward. - Use Zyrtec 10 mg daily as needed.  - Please make sure you confirm with the [facility] that allergy shots will be covered and call us back once you know for sure so we can get your vials mixed.    Pollen Food Allergy Syndrome: - These symptoms are typically not life-threatening and are because of a cross reaction between a pollen you are allergic to, and to a protein in specific foods (such as fresh fruits, vegetables, and nuts). - If you can eat these things and tolerate the symptoms, it is fine to continue to do so.  If not, you may avoid these fresh fruits and vegetables.   - Heating these foods, buying them canned, and peeling these foods should allow them to be consumed without symptoms or with less symptoms.  Cough/Shortness of breath: - Continue follow up at the [place]. - Continue Advair 115-60mcg 2 puffs daily. - Rescue inhaler: Albuterol 2 puffs via spacer every 4-6 hours as needed for respiratory symptoms of cough, shortness of breath, or wheezing   ALLERGEN AVOIDANCE MEASURES  Dust Mites Use central air conditioning and heat; and change the filter monthly.  Pleated filters work better than mesh filters.  Electrostatic filters may also be used; wash the filter monthly.  Window air conditioners may be used, but do not clean the air as well as a central air conditioner.  Change or wash the filter monthly. Keep windows closed.  Do not use attic fans.   Encase the mattress, box springs and pillows with zippered, dust proof covers. Wash the bed linens in hot water weekly.   Remove carpet, especially from the bedroom. Remove stuffed animals, throw pillows, dust ruffles, heavy drapes and other items that collect  dust from the bedroom. Do not use a humidifier.   Use wood, vinyl or leather furniture instead of cloth furniture in the bedroom. Keep the indoor humidity at 30 - 40%.  Monitor with a humidity gauge.   Pollen Avoidance Pollen levels are highest during the mid-day and afternoon.  Consider this when planning outdoor activities. Avoid being outside when the grass is being mowed, or wear a mask if the pollen-allergic person must be the one to mow the grass. Keep the windows closed to keep pollen outside of the home. Use an air conditioner to filter the air. Take a shower, wash hair, and change clothing after working or playing outdoors during pollen season.

## 2022-04-30 NOTE — Progress Notes (Signed)
NEW PATIENT  Date of Service/Encounter:  04/30/22  Consult requested by: Administration, Veterans   Subjective:   Yvette Caldwell (DOB: 2000/09/03) is a 21 y.o. female who presents to the clinic on 04/30/2022 with a chief complaint of Allergy Testing (Environmental: All/Food: Hazle Coca, Tree Nuts; Peanuts), Eczema, Asthma (Never Diagnosis ), and Cough (Morning dry cough everyday but has gotten better since being on allergy medication on/off x since 2019) She was referred to [place] by the [facility] for uncontrolled allergic conjunctivitis with maximal medical therapy.  History obtained from: chart review and patient.   Cough/Shortness of breath Reports around 2019, she has noted dry cough and shortness of breath.  This is worse during the daytime and has not noticed much nighttime symptoms except for a few times a years.  Symptoms are worse with activity. She has tried using her albuterol for this but it does not seem to help much.  She was then started on Advair HFA 163mcg 2 puffs daily and reports this has improved her cough but she still has some SOB especially with exertion.  She has not used albuterol much recently but mostly because her SOB does not improve with it.   Of note, she was previously on Singulair but it caused worsening of her depression so it was stopped.  PFT done at the [place] in 2021 showed no obstruction.  Also had a CXR done that was unremarkable.  She does not have an official diagnosis of asthma but they are treating this as cough variant asthma based on her response to ICS/LABA. No prednisone courses that she recalls recently 0 number of lifetime hospitalizations, 0 number of lifetime intubations.  Denies history or current use of tobacco  Rhinitis:  Started around teenage years  Symptoms include: nasal congestion, rhinorrhea, sneezing, watery eyes, and itchy eyes  Occurs year-round Potential triggers: none Treatments tried: Flonase, Azelastine, Allegra PRN.  Used it about 3 days  ago.  Previous allergy testing: yes last year  History of reflux/heartburn: no History of chronic sinusitis or sinus surgery: no. She did undergo CT sinus at [facility] which showed mild mucosal ethmoid thickening and R maxillary sinus cyst.    Concern for Food Allergy:  Foods of concern: banana, pears History of reaction: eating fresh fruits sometimes causes itching of the mouth but she is able to tolerate it when it is processed like in fruit cups. No other symptoms. Previous allergy testing no Carries an epinephrine autoinjector: no   Past Medical History: Past Medical History:  Diagnosis Date   Allergic rhinitis    Depression    Eczema    Family history of breast cancer    Family history of breast cancer    Family history of colon cancer    Insomnia    PTSD (post-traumatic stress disorder)    Stress fracture    Past Surgical History: Past Surgical History:  Procedure Laterality Date   WISDOM TOOTH EXTRACTION      Family History: Family History  Problem Relation Age of Onset   Pancreatitis Mother    Prolactinoma Mother    Cancer Paternal Aunt        NOS   Colon cancer Maternal Grandmother 40   Cancer Paternal Grandmother        NOS   Breast cancer Other 45   Prolactinoma Other    Prostate cancer Other        MGFs brother   Colon cancer Maternal Great-grandmother        MGF's  mother    Social History:  Lives in a unknown year apartment Flooring in bedroom: carpet Pets: dog Tobacco use/exposure: none Job: unemployed  Medication List:  Allergies as of 04/30/2022   No Known Allergies      Medication List        Accurate as of April 30, 2022  4:40 PM. If you have any questions, ask your nurse or doctor.          albuterol 108 (90 Base) MCG/ACT inhaler Commonly known as: VENTOLIN HFA Inhale 1 puff into the lungs as needed.   ALLEGRA PO Take by mouth. What changed: Another medication with the same name was added. Make sure you understand how and  when to take each. Changed by: Birder Robson, MD   fexofenadine 180 MG tablet Commonly known as: Allegra Allergy Take 1 tablet (180 mg total) by mouth daily. What changed: You were already taking a medication with the same name, and this prescription was added. Make sure you understand how and when to take each. Changed by: Birder Robson, MD   azelastine 0.1 % nasal spray Commonly known as: ASTELIN Place 2 sprays into both nostrils 2 (two) times daily. What changed: how to take this Changed by: Birder Robson, MD   clobetasol ointment 0.05 % Commonly known as: TEMOVATE Apply 0.05 Applications topically as directed.   cyclobenzaprine 10 MG tablet Commonly known as: FLEXERIL Take 10 mg by mouth at bedtime.   EPINEPHrine 0.3 mg/0.3 mL Soaj injection Commonly known as: EpiPen 2-Pak Inject 0.3 mg into the muscle as needed for anaphylaxis. Started by: Birder Robson, MD   fluticasone 50 MCG/ACT nasal spray Commonly known as: FLONASE Place 2 sprays into both nostrils daily. Started by: Birder Robson, MD   FLUTICASONE PROPIONATE HFA IN Inhale 2 puffs into the lungs as directed.   fluticasone-salmeterol 115-21 MCG/ACT inhaler Commonly known as: ADVAIR HFA Inhale 2 puffs into the lungs as directed.   hydrALAZINE 10 MG tablet Commonly known as: APRESOLINE Take 1 tablet by mouth as directed.   HYDROPHILIC EX Apply 1 Application topically as directed.   hydrOXYzine 10 MG tablet Commonly known as: ATARAX Take 10 mg by mouth at bedtime.   ketoconazole 2 % shampoo Commonly known as: NIZORAL Apply 2 Applications topically as directed.   ketotifen 0.025 % ophthalmic solution Commonly known as: ZADITOR Place 1 drop into both eyes as directed.   Lactobacillus Acidophilus Powd Take 1 Capful by mouth as directed.   mirtazapine 45 MG tablet Commonly known as: REMERON Take 1 tablet by mouth at bedtime.   ondansetron 4 MG disintegrating tablet Commonly known as: Zofran  ODT Take 1 tablet (4 mg total) by mouth every 8 (eight) hours as needed for nausea or vomiting.   PSYLLIUM PO Take 2 Applications by mouth as directed.   rizatriptan 10 MG disintegrating tablet Commonly known as: MAXALT-MLT Take 10 mg by mouth as directed.   sertraline 100 MG tablet Commonly known as: ZOLOFT Take 100 mg by mouth as directed.   SUMAtriptan 50 MG tablet Commonly known as: IMITREX Take 50 mg by mouth as directed.         REVIEW OF SYSTEMS: Pertinent positives and negatives discussed in HPI.   Objective:   Physical Exam: BP 110/70   Pulse (!) 102   Temp 98 F (36.7 C)   Resp 16   Ht 5\' 8"  (1.727 m)   Wt 169 lb 6.4 oz (76.8 kg)  SpO2 96%   BMI 25.76 kg/m  Body mass index is 25.76 kg/m. GEN: alert, well developed HEENT: clear conjunctiva, TM grey and translucent, nose with + inferior turbinate hypertrophy, pale nasal mucosa, slight clear rhinorrhea, + cobblestoning HEART: regular rate and rhythm, no murmur LUNGS: clear to auscultation bilaterally, no coughing, unlabored respiration ABDOMEN: soft, non distended  SKIN: no rashes or lesions  [facility]ed[facility] as discussed in HPI  Spirometry:  Tracings reviewed. Her effort: Good reproducible efforts. FVC: 3.14 L FEV1: 2.57 L, 79% predicted FEV1/FVC ratio: 82% Interpretation: Spirometry consistent with normal pattern.  Please see scanned spirometry results for details.  Skin Testing:  Skin prick testing was placed, which includes aeroallergens/foods, histamine control, and saline control.  Verbal consent was obtained prior to placing test.  We discussed risks including anaphylaxis. Patient tolerated procedure well.  Allergy testing results were read and interpreted by myself, documented by clinical staff. Adequate positive and negative control.  Results discussed with patient/family.  Airborne Adult Perc - 04/30/22 1430     Time Antigen Placed 1430    Allergen Manufacturer Lavella Hammock    Location  Back    Number of Test 59    2. Control-Histamine 1 mg/ml 2+    4. Oxford Negative    5. [place] 2+    6. Johnson Negative    7. [place] Blue 2+    8. Meadow Fescue 2+    9. Perennial Rye Negative    10. Sweet Vernal 2+    11. Timothy --   1+   12. Cocklebur Negative    13. Burweed Marshelder Negative    14. Ragweed, short Negative    15. Ragweed, Giant --   1+   16. Plantain,  English Negative    17. Lamb's Quarters Negative    18. Sheep Sorrell Negative    19. Rough Pigweed Negative    20. Marsh Elder, Rough Negative    21. Mugwort, Common Negative    22. Ash mix --   1+   23. Birch mix --   1+   24. Beech American Negative    25. Box, Elder --   1+   26. Cedar, red --   1+   27. Cottonwood, [place] Negative    28. Elm mix --   1+   29. Hickory --   1+   30. Maple mix --   1+   31. Oak, [place] mix Negative    32. Pecan Pollen --   1+   33. Pine mix Negative    34. Sycamore Eastern Negative    35. Linwood, Black Pollen --   1+   36. Alternaria alternata Negative    37. Cladosporium Herbarum Negative    38. Aspergillus mix Negative    39. Penicillium mix Negative    40. Bipolaris sorokiniana (Helminthosporium) Negative    41. Drechslera spicifera (Curvularia) Negative    42. Mucor plumbeus Negative    43. Fusarium moniliforme Negative    44. Aureobasidium pullulans (pullulara) Negative    45. Rhizopus oryzae Negative    46. Botrytis cinera Negative    47. Epicoccum nigrum Negative    48. Phoma betae Negative    49. Candida Albicans Negative    50. Trichophyton mentagrophytes Negative    51. Mite, D Farinae  5,000 AU/ml Negative    52. Mite, D Pteronyssinus  5,000 AU/ml 2+    53. Cat Hair 10,000 BAU/ml Negative    54.  Dog Epithelia  Negative    55. Mixed Feathers Negative    56. Horse Epithelia Negative    57. Cockroach, German Negative    58. Mouse Negative    59. Tobacco Leaf Negative             Intradermal - 04/30/22 1603     Time Antigen Placed  1603    Allergen Manufacturer Lavella Hammock    Location Arm    Number of Test 9    Control Negative    Johnson --   1+   Mold 1 Negative    Mold 2 Negative    Mold 3 Negative    Mold 4 Negative    Cat --   1+   Dog Negative    Cockroach Negative               Assessment:   1. Seasonal and perennial allergic rhinitis   2. Pollen-food allergy, initial encounter   3. Allergic conjunctivitis of both eyes   4. Cough variant asthma     Plan/Recommendations:   Allergic Rhinoconjunctivitis: - Referred by the [facility] for AIT due to uncontrolled rhinitis with maximal medical therapy. - Avoidance measures discussed. - Use nasal saline rinses before nose sprays such as with Neilmed Sinus Rinse.  Use distilled water.   - Use Flonase 2 sprays each nostril daily. Aim upward and outward. - Use Azelastine 2 sprays each nostril twice daily. Aim upward and outward. - Use Zyrtec 10 mg daily as needed.  - Yvette send in Epipen for AIT and put in prescription for AIT  Pollen Food Allergy Syndrome: - These symptoms are typically not life-threatening and are because of a cross reaction between a pollen you are allergic to, and to a protein in specific foods (such as fresh fruits, vegetables, and nuts). - If you can eat these things and tolerate the symptoms, it is fine to continue to do so.  If not, you may avoid these fresh fruits and vegetables.   - Heating these foods, buying them canned, and peeling these foods should allow them to be consumed without symptoms or with less symptoms.  Cough/Shortness of breath: - Possibly cough variant asthma as she responds to ICS/LABA and has not shown obstruction of PFT - Continue follow up at the [place] and inhalers as directed. - Continue Advair 115-57mcg 2 puffs daily. - Rescue inhaler: Albuterol 2 puffs via spacer every 4-6 hours as needed for respiratory symptoms of cough, shortness of breath, or wheezing   Return in about 3 months (around 07/30/2022).  Harlon Flor, MD [facility]

## 2022-05-03 NOTE — Progress Notes (Signed)
Aeroallergen Immunotherapy   Ordering Provider: Harlon Flor   Patient Details  Name: Yvette Caldwell  MRN: [id number]  Date of Birth: [birth year]   Order 1 of 1   Vial Label: Trees, [place], [place], DM, Cat   0.3 ml (Volume)  BAU Concentration -- 7 Grass Mix* 100,000 ([address] [place], [place], [place], [place] Rye, RedTop, Sweet Vernal, Timothy)  0.3 ml (Volume)  BAU Concentration -- [place] 10,000  0.2 ml (Volume)  1:20 Concentration -- Johnson  0.3 ml (Volume)  1:20 Concentration -- Ragweed Mix  0.2 ml (Volume)  1:20 Concentration -- Ash mix*  0.2 ml (Volume)  1:10 Concentration -- Birch mix*  0.2 ml (Volume)  1:20 Concentration -- Box Elder  0.2 ml (Volume)  1:10 Concentration -- Cedar, red  0.2 ml (Volume)  1:20 Concentration -- Elm Mix*  0.2 ml (Volume)  1:10 Concentration -- Hickory*  0.2 ml (Volume)  1:20 Concentration -- Maple Mix*  0.2 ml (Volume)  1:10 Concentration -- Pecan Pollen  0.2 ml (Volume)  1:20 Concentration -- Walnut, Black Pollen  0.5 ml (Volume)  1:10 Concentration -- Cat Hair  0.5 ml (Volume)   AU Concentration -- Mite Mix (DF 5,000 & DP 5,000)    3.9  ml Extract Subtotal  1.1  ml Diluent  5.0  ml Maintenance Total   Schedule:  B  Silver Vial (1:1,000,000): Schedule B (6 doses)  Blue Vial (1:100,000): Schedule B (6 doses)  Yellow Vial (1:10,000): Schedule B (6 doses)  Green Vial (1:1,000): Schedule B (6 doses)  Red Vial (1:100): Schedule B (6 doses)   Special Instructions: [facility] patient, consent obtained, prescribed Epipen

## 2022-05-03 NOTE — Progress Notes (Signed)
VIALS EXP 05-04-23 

## 2022-05-04 DIAGNOSIS — J3089 Other allergic rhinitis: Secondary | ICD-10-CM

## 2022-05-11 ENCOUNTER — Telehealth: Payer: Self-pay | Admitting: Internal Medicine

## 2022-05-11 MED ORDER — EPINEPHRINE 0.3 MG/0.3ML IJ SOAJ: 0.3000 mg | INTRAMUSCULAR | 1 refills | Status: DC | PRN

## 2022-05-11 NOTE — Telephone Encounter (Signed)
Epipen has been sent to [place] and patient has been notified and verbalized understanding.

## 2022-05-11 NOTE — Telephone Encounter (Addendum)
Patient called and said that you need to [facility] [place] .With [facility] ins card, so would not have to pay for it. [phone]

## 2022-05-21 ENCOUNTER — Ambulatory Visit (INDEPENDENT_AMBULATORY_CARE_PROVIDER_SITE_OTHER): Payer: No Typology Code available for payment source

## 2022-05-21 DIAGNOSIS — J309 Allergic rhinitis, unspecified: Secondary | ICD-10-CM

## 2022-05-21 NOTE — Progress Notes (Signed)
Immunotherapy   Patient Details  Name: Yvette Caldwell MRN: [id number] Date of Birth: October 25, 2000  05/21/2022  Will Bonnet started injections for  T-G-W-DM-C Following schedule: B  Frequency:1 time per week Epi-Pen:Epi-Pen Available  Consent signed and patient instructions given. Patient waited in office 15 minutes without any issues.    Guy Franco 05/21/2022, 11:10 AM

## 2022-05-26 ENCOUNTER — Ambulatory Visit (INDEPENDENT_AMBULATORY_CARE_PROVIDER_SITE_OTHER): Payer: No Typology Code available for payment source | Admitting: *Deleted

## 2022-05-26 DIAGNOSIS — J309 Allergic rhinitis, unspecified: Secondary | ICD-10-CM

## 2022-06-14 ENCOUNTER — Ambulatory Visit (INDEPENDENT_AMBULATORY_CARE_PROVIDER_SITE_OTHER): Payer: No Typology Code available for payment source | Admitting: *Deleted

## 2022-06-14 DIAGNOSIS — J309 Allergic rhinitis, unspecified: Secondary | ICD-10-CM | POA: Diagnosis not present

## 2022-06-23 ENCOUNTER — Ambulatory Visit (INDEPENDENT_AMBULATORY_CARE_PROVIDER_SITE_OTHER): Payer: No Typology Code available for payment source | Admitting: *Deleted

## 2022-06-23 DIAGNOSIS — J309 Allergic rhinitis, unspecified: Secondary | ICD-10-CM

## 2022-07-01 ENCOUNTER — Ambulatory Visit (INDEPENDENT_AMBULATORY_CARE_PROVIDER_SITE_OTHER): Payer: No Typology Code available for payment source

## 2022-07-01 DIAGNOSIS — J309 Allergic rhinitis, unspecified: Secondary | ICD-10-CM | POA: Diagnosis not present

## 2022-07-12 ENCOUNTER — Ambulatory Visit (INDEPENDENT_AMBULATORY_CARE_PROVIDER_SITE_OTHER): Payer: No Typology Code available for payment source

## 2022-07-12 DIAGNOSIS — J309 Allergic rhinitis, unspecified: Secondary | ICD-10-CM | POA: Diagnosis not present

## 2022-07-22 ENCOUNTER — Encounter: Payer: Self-pay | Admitting: Genetic Counselor

## 2022-07-22 NOTE — Progress Notes (Signed)
UPDATE: The AXIN2 p.N212S VUS has been reclassified as Likely Benign.  The amended report date is July 21, 2022.

## 2022-07-30 ENCOUNTER — Ambulatory Visit (INDEPENDENT_AMBULATORY_CARE_PROVIDER_SITE_OTHER): Payer: No Typology Code available for payment source | Admitting: Internal Medicine

## 2022-07-30 ENCOUNTER — Encounter: Payer: Self-pay | Admitting: Internal Medicine

## 2022-07-30 ENCOUNTER — Ambulatory Visit (INDEPENDENT_AMBULATORY_CARE_PROVIDER_SITE_OTHER): Payer: No Typology Code available for payment source

## 2022-07-30 ENCOUNTER — Other Ambulatory Visit: Payer: Self-pay

## 2022-07-30 VITALS — BP 102/64 | HR 80 | Temp 98.5°F | Resp 16 | Wt 175.3 lb

## 2022-07-30 DIAGNOSIS — K9049 Malabsorption due to intolerance, not elsewhere classified: Secondary | ICD-10-CM

## 2022-07-30 DIAGNOSIS — J309 Allergic rhinitis, unspecified: Secondary | ICD-10-CM | POA: Diagnosis not present

## 2022-07-30 DIAGNOSIS — J45991 Cough variant asthma: Secondary | ICD-10-CM | POA: Diagnosis not present

## 2022-07-30 DIAGNOSIS — J3089 Other allergic rhinitis: Secondary | ICD-10-CM | POA: Diagnosis not present

## 2022-07-30 DIAGNOSIS — H1013 Acute atopic conjunctivitis, bilateral: Secondary | ICD-10-CM

## 2022-07-30 DIAGNOSIS — J302 Other seasonal allergic rhinitis: Secondary | ICD-10-CM

## 2022-07-30 DIAGNOSIS — T781XXD Other adverse food reactions, not elsewhere classified, subsequent encounter: Secondary | ICD-10-CM

## 2022-07-30 MED ORDER — AZELASTINE HCL 0.1 % NA SOLN: 2.0000 | Freq: Two times a day (BID) | NASAL | 5 refills | Status: DC

## 2022-07-30 MED ORDER — FLUTICASONE PROPIONATE 50 MCG/ACT NA SUSP: 2.0000 | Freq: Every day | NASAL | 5 refills | Status: DC

## 2022-07-30 MED ORDER — FEXOFENADINE HCL 180 MG PO TABS: 180.0000 mg | ORAL_TABLET | Freq: Every day | ORAL | 5 refills | Status: DC

## 2022-07-30 NOTE — Progress Notes (Signed)
FOLLOW UP Date of Service/Encounter:  07/30/22   Subjective:  Yvette Caldwell (DOB: September 01, 2000) is a 21 y.o. female who [facility] for follow up for allergic rhinoconjunctivitis, PFAS, cough variant asthma.   History obtained from: chart review and patient. At last visit 04/30/2022 with me, she was seen for allergic rhinoconjunctivitis for consideration of AIT.  She also has PFAS and cough variant asthma and is followed by the [place].  She is on Advair HFA 2 puffs twice daily.  She was also started on Flonase, Azelastine, oral anti histamine.  She also decided she wanted to start AIT.    Since last visit, she has started on allergy shots.  She was initially coming once a week but then has had some missed appointments and has to had to backdose so she is still on the first silver vial.  She reports overall doing okay with minimal congestion/runny nose. Not much ocular symptoms.  She is doing Flonase daily but not Azelastine.  She takes Careers adviser daily.  She is also followed by the [facility] for cough variant asthma.  She is using Advair HFA 2 puffs twice daily.  She does report some coughing in the morning but it gets better on its own.  Denies albuterol use sine last visit.  No ER visits/oral prednisone since last visit.    She still avoids fresh banana and peaches as it causes mouth itching.     Past Medical History: Past Medical History:  Diagnosis Date   Allergic rhinitis    Depression    Eczema    Family history of breast cancer    Family history of breast cancer    Family history of colon cancer    Insomnia    PTSD (post-traumatic stress disorder)    Stress fracture     Objective:  BP 102/64   Pulse 80   Temp 98.5 F (36.9 C) (Temporal)   Resp 16   Wt 175 lb 4.8 oz (79.5 kg)   SpO2 97%   BMI 26.65 kg/m  Body mass index is 26.65 kg/m. Physical Exam: GEN: alert, well developed HEENT: clear conjunctiva, TM grey  and translucent, nose with moderate inferior turbinate hypertrophy, pink nasal mucosa, clear rhinorrhea, + cobblestoning HEART: regular rate and rhythm, no murmur LUNGS: clear to auscultation bilaterally, no coughing, unlabored respiration SKIN: no rashes or lesions   Assessment/Plan  Allergic Rhinitis Allergic Conjunctivitis - Avoidance measures discussed. - Use nasal saline rinses before nose sprays such as with Neilmed Sinus Rinse.  Use distilled water.   - Use Flonase 2 sprays each nostril daily. Aim upward and outward. - Use Azelastine 2 sprays each nostril twice daily. Aim upward and outward. - Use Allegra 180mg  daily.  - Continue allergen immunotherapy on schedule. Remember to come once a week and always bring your Epipen with you for the shots.  AIT initiated 05/21/2022.   Pollen Food Allergy Syndrome- Banana/Pears - These symptoms are typically not life-threatening and are because of a cross reaction between a pollen you are allergic to, and to a protein in specific foods (such as fresh fruits, vegetables, and nuts). - If you can eat these things and tolerate the symptoms, it is fine to continue to do so.  If not, you may avoid these fresh fruits and vegetables.   - Heating these foods, buying them canned, and peeling these foods should allow them to be consumed without symptoms or with less symptoms.  Cough Variant Asthma - Continue follow up at the [place]. - Continue Advair 115-18mcg 2 puffs twice daily. - Rescue inhaler: Albuterol 2 puffs via spacer every 4-6 hours as needed for respiratory symptoms of cough, shortness of breath, or wheezing   Return in about 3 months (around 10/29/2022). Alesia Morin, MD  [facility]

## 2022-07-30 NOTE — Patient Instructions (Addendum)
Allergic Rhinitis Allergic Conjunctivitis - Avoidance measures discussed. - Use nasal saline rinses before nose sprays such as with Neilmed Sinus Rinse.  Use distilled water.   - Use Flonase 2 sprays each nostril daily. Aim upward and outward. - Use Azelastine 2 sprays each nostril twice daily. Aim upward and outward. - Use Allegra 180mg  daily.  - Continue allergen immunotherapy on schedule. Remember to come once a week and always bring your Epipen with you for the shots.    Pollen Food Allergy Syndrome- Banana/Pears - These symptoms are typically not life-threatening and are because of a cross reaction between a pollen you are allergic to, and to a protein in specific foods (such as fresh fruits, vegetables, and nuts). - If you can eat these things and tolerate the symptoms, it is fine to continue to do so.  If not, you may avoid these fresh fruits and vegetables.   - Heating these foods, buying them canned, and peeling these foods should allow them to be consumed without symptoms or with less symptoms.  Cough/Shortness of breath: - Continue follow up at the . - Continue Advair 115-11mcg 2 puffs twice daily. - Rescue inhaler: Albuterol 2 puffs via spacer every 4-6 hours as needed for respiratory symptoms of cough, shortness of breath, or wheezing

## 2022-08-04 ENCOUNTER — Ambulatory Visit (INDEPENDENT_AMBULATORY_CARE_PROVIDER_SITE_OTHER): Payer: No Typology Code available for payment source

## 2022-08-04 DIAGNOSIS — J309 Allergic rhinitis, unspecified: Secondary | ICD-10-CM | POA: Diagnosis not present

## 2022-08-11 ENCOUNTER — Ambulatory Visit (INDEPENDENT_AMBULATORY_CARE_PROVIDER_SITE_OTHER): Payer: No Typology Code available for payment source

## 2022-08-11 DIAGNOSIS — J309 Allergic rhinitis, unspecified: Secondary | ICD-10-CM

## 2022-08-18 ENCOUNTER — Ambulatory Visit (INDEPENDENT_AMBULATORY_CARE_PROVIDER_SITE_OTHER): Payer: No Typology Code available for payment source

## 2022-08-18 DIAGNOSIS — J309 Allergic rhinitis, unspecified: Secondary | ICD-10-CM

## 2022-08-26 ENCOUNTER — Ambulatory Visit (INDEPENDENT_AMBULATORY_CARE_PROVIDER_SITE_OTHER): Payer: No Typology Code available for payment source

## 2022-08-26 DIAGNOSIS — J309 Allergic rhinitis, unspecified: Secondary | ICD-10-CM | POA: Diagnosis not present

## 2022-09-02 ENCOUNTER — Ambulatory Visit (INDEPENDENT_AMBULATORY_CARE_PROVIDER_SITE_OTHER): Payer: No Typology Code available for payment source

## 2022-09-02 DIAGNOSIS — J309 Allergic rhinitis, unspecified: Secondary | ICD-10-CM

## 2022-09-06 ENCOUNTER — Other Ambulatory Visit: Payer: Self-pay | Admitting: Urology

## 2022-09-08 ENCOUNTER — Ambulatory Visit (INDEPENDENT_AMBULATORY_CARE_PROVIDER_SITE_OTHER): Payer: No Typology Code available for payment source

## 2022-09-08 DIAGNOSIS — J309 Allergic rhinitis, unspecified: Secondary | ICD-10-CM | POA: Diagnosis not present

## 2022-09-14 ENCOUNTER — Encounter (HOSPITAL_BASED_OUTPATIENT_CLINIC_OR_DEPARTMENT_OTHER): Payer: Self-pay | Admitting: Urology

## 2022-09-14 NOTE — Progress Notes (Signed)
Spoke w/ via phone for pre-op interview--- [facility] needs dos----UPT               Lab results------ COVID test -----patient states asymptomatic no test needed Arrive at -------1130 NPO after MN NO Solid Food.  Clear liquids from MN until---1030 Med rec completed Medications to take morning of surgery -----Inhalers, nasal sprays, zoloft and wellbutrin Diabetic medication ----- Patient instructed no nail polish to be worn day of surgery Patient instructed to bring photo id and insurance card day of surgery Patient aware to have Driver (ride ) / caregiver Mother or Husband   for 24 hours after surgery  Patient Special Instructions ----- Pre-Op special Istructions ----- Patient verbalized understanding of instructions that were given at this phone interview. Patient denies shortness of breath, chest pain, fever, cough at this phone interview.

## 2022-09-17 NOTE — H&P (Signed)
CC/HPI: cc: Urachal remnant   07/23/2022: 22 year old woman who presented to ED with LUQ abdominal pain and imaging showed patent urachal remnant. The pain has subsequently resolved. She denies UTIs, hematuria, or umbilical drainage.   08/27/22: Here for cystoscopy to evaluate for urachal remnant. No UTIs or pain.     ALLERGIES: Pollen    MEDICATIONS: Azelastine Hcl  Clobetasol Propionate 0.05 % cream  Cyclobenzaprine Hcl  Epinephrine 0.3 mg/0.3 ml auto-injector  Fexofenadine Hcl 180 mg tablet  Flonase Allergy Relief  Fluticasone Propionate  Hydralazine Hcl  Hydrophor 42 % ointment  Ketoconazole  Ketotifen Fumarate  Lactobacillus  Mirtazapine  Rizatriptan 10 mg tablet  Ventolin Hfa     GU PSH: None   NON-GU PSH: None   GU PMH: Abnormal radiologic findings on diagnostic imaging of renal pelvis, ureter, or bladder - 07/23/2022    NON-GU PMH: Anxiety Arthritis Depression Sleep Apnea    FAMILY HISTORY: Kidney Failure - Runs in Family Kidney Stones - Runs in Family   SOCIAL HISTORY: Marital Status: Single Preferred Language: English; Ethnicity: Not Hispanic Or Latino; Race: Black or African American Current Smoking Status: Patient has never smoked.   Tobacco Use Assessment Completed: Used Tobacco in last 30 days? Has never drank.  Drinks 1 caffeinated drink per day.    REVIEW OF SYSTEMS:    GU Review Female:   Patient denies frequent urination, hard to postpone urination, burning /pain with urination, get up at night to urinate, leakage of urine, stream starts and stops, trouble starting your stream, have to strain to urinate, and being pregnant.  Gastrointestinal (Upper):   Patient denies nausea, vomiting, and indigestion/ heartburn.  Gastrointestinal (Lower):   Patient denies diarrhea and constipation.  Constitutional:   Patient denies fever, night sweats, weight loss, and fatigue.  Skin:   Patient denies skin rash/ lesion and itching.  Eyes:   Patient denies  blurred vision and double vision.  Ears/ Nose/ Throat:   Patient denies sore throat and sinus problems.  Hematologic/Lymphatic:   Patient denies swollen glands and easy bruising.  Cardiovascular:   Patient denies leg swelling and chest pains.  Respiratory:   Patient denies cough and shortness of breath.  Endocrine:   Patient denies excessive thirst.  Musculoskeletal:   Patient denies back pain and joint pain.  Neurological:   Patient denies headaches and dizziness.  Psychologic:   Patient denies depression and anxiety.   VITAL SIGNS:      08/27/2022 03:58 PM  Weight 175 lb / 79.38 kg  Height 68 in / 172.72 cm  BP 100/65 mmHg  Pulse 78 /min  Temperature 97.5 F / 36.3 C  BMI 26.6 kg/m   GU PHYSICAL EXAMINATION:    External Genitalia: No hirsutism, no rash, no scarring, no cyst, no erythematous lesion, no papular lesion, no blanched lesion, no warty lesion. No edema.  Urethral Meatus: Mild urethral meatal stenosis. Normal position. No discharge.    MULTI-SYSTEM PHYSICAL EXAMINATION:    Constitutional: Well-nourished. No physical deformities. Normally developed. Good grooming.  Neck: Neck symmetrical, not swollen. Normal tracheal position.  Respiratory: No labored breathing, no use of accessory muscles.   Skin: No paleness, no jaundice, no cyanosis. No lesion, no ulcer, no rash.  Neurologic / Psychiatric: Oriented to time, oriented to place, oriented to person. No depression, no anxiety, no agitation.  Eyes: Normal conjunctivae. Normal eyelids.  Ears, Nose, Mouth, and Throat: Left ear no scars, no lesions, no masses. Right ear no scars, no lesions, no masses.  Nose no scars, no lesions, no masses. Normal hearing. Normal lips.  Musculoskeletal: Normal gait and station of head and neck.     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

## 2022-09-21 ENCOUNTER — Ambulatory Visit (HOSPITAL_BASED_OUTPATIENT_CLINIC_OR_DEPARTMENT_OTHER): Payer: No Typology Code available for payment source | Admitting: Anesthesiology

## 2022-09-21 ENCOUNTER — Ambulatory Visit (HOSPITAL_BASED_OUTPATIENT_CLINIC_OR_DEPARTMENT_OTHER)
Admission: RE | Admit: 2022-09-21 | Discharge: 2022-09-21 | Disposition: A | Discharge status: Home / Self Care | Payer: No Typology Code available for payment source | Attending: Urology | Admitting: Urology

## 2022-09-21 ENCOUNTER — Encounter (HOSPITAL_BASED_OUTPATIENT_CLINIC_OR_DEPARTMENT_OTHER)
Admission: RE | Disposition: A | Discharge status: Home / Self Care | Payer: Self-pay | Source: Home / Self Care | Attending: Urology

## 2022-09-21 ENCOUNTER — Encounter (HOSPITAL_BASED_OUTPATIENT_CLINIC_OR_DEPARTMENT_OTHER): Payer: Self-pay | Admitting: Urology

## 2022-09-21 DIAGNOSIS — N3592 Unspecified urethral stricture, female: Secondary | ICD-10-CM

## 2022-09-21 DIAGNOSIS — Z01818 Encounter for other preprocedural examination: Secondary | ICD-10-CM

## 2022-09-21 DIAGNOSIS — Q644 Malformation of urachus: Secondary | ICD-10-CM | POA: Diagnosis present

## 2022-09-21 DIAGNOSIS — G4730 Sleep apnea, unspecified: Secondary | ICD-10-CM | POA: Diagnosis not present

## 2022-09-21 DIAGNOSIS — F418 Other specified anxiety disorders: Secondary | ICD-10-CM

## 2022-09-21 HISTORY — PX: CYSTOSCOPY WITH URETHRAL DILATATION: SHX5125

## 2022-09-21 HISTORY — DX: Sleep apnea, unspecified: G47.30

## 2022-09-21 LAB — POCT PREGNANCY, URINE: Preg Test, Ur: NEGATIVE

## 2022-09-21 SURGERY — CYSTOSCOPY, WITH URETHRAL DILATION
Anesthesia: Monitor Anesthesia Care | Site: Urethra

## 2022-09-21 MED ORDER — FENTANYL CITRATE (PF) 100 MCG/2ML IJ SOLN
INTRAMUSCULAR | Status: AC
  Filled 2022-09-21: qty 2

## 2022-09-21 MED ORDER — CEFAZOLIN SODIUM-DEXTROSE 2-4 GM/100ML-% IV SOLN
INTRAVENOUS | Status: AC
  Filled 2022-09-21: qty 100

## 2022-09-21 MED ORDER — ACETAMINOPHEN 500 MG PO TABS
1000.0000 mg | ORAL_TABLET | Freq: Once | ORAL | Status: AC
  Administered 2022-09-21: 1000 mg via ORAL

## 2022-09-21 MED ORDER — ACETAMINOPHEN 500 MG PO TABS
ORAL_TABLET | ORAL | Status: AC
  Filled 2022-09-21: qty 2

## 2022-09-21 MED ORDER — STERILE WATER FOR IRRIGATION IR SOLN
Status: DC | PRN
  Administered 2022-09-21: 3000 mL

## 2022-09-21 MED ORDER — KETOROLAC TROMETHAMINE 30 MG/ML IJ SOLN: 30.0000 mg | Freq: Once | INTRAMUSCULAR | Status: DC | PRN

## 2022-09-21 MED ORDER — PROMETHAZINE HCL 25 MG/ML IJ SOLN: 6.2500 mg | INTRAMUSCULAR | Status: DC | PRN

## 2022-09-21 MED ORDER — AMISULPRIDE (ANTIEMETIC) 5 MG/2ML IV SOLN: 10.0000 mg | Freq: Once | INTRAVENOUS | Status: DC | PRN

## 2022-09-21 MED ORDER — MIDAZOLAM HCL 2 MG/2ML IJ SOLN
INTRAMUSCULAR | Status: DC | PRN
  Administered 2022-09-21 (×2): 1 mg via INTRAVENOUS

## 2022-09-21 MED ORDER — FENTANYL CITRATE (PF) 100 MCG/2ML IJ SOLN: 25.0000 ug | INTRAMUSCULAR | Status: DC | PRN

## 2022-09-21 MED ORDER — OXYCODONE HCL 5 MG/5ML PO SOLN: 5.0000 mg | Freq: Once | ORAL | Status: DC | PRN

## 2022-09-21 MED ORDER — MIDAZOLAM HCL 2 MG/2ML IJ SOLN
INTRAMUSCULAR | Status: AC
  Filled 2022-09-21: qty 2

## 2022-09-21 MED ORDER — PROPOFOL 500 MG/50ML IV EMUL
INTRAVENOUS | Status: DC | PRN
  Administered 2022-09-21: 200 ug/kg/min via INTRAVENOUS

## 2022-09-21 MED ORDER — CEFAZOLIN SODIUM-DEXTROSE 2-4 GM/100ML-% IV SOLN
2.0000 g | INTRAVENOUS | Status: AC
  Administered 2022-09-21: 2 g via INTRAVENOUS

## 2022-09-21 MED ORDER — PROPOFOL 500 MG/50ML IV EMUL
INTRAVENOUS | Status: AC
  Filled 2022-09-21: qty 50

## 2022-09-21 MED ORDER — LACTATED RINGERS IV SOLN: INTRAVENOUS | Status: DC

## 2022-09-21 MED ORDER — FENTANYL CITRATE (PF) 100 MCG/2ML IJ SOLN
INTRAMUSCULAR | Status: DC | PRN
  Administered 2022-09-21: 50 ug via INTRAVENOUS

## 2022-09-21 MED ORDER — OXYCODONE HCL 5 MG PO TABS: 5.0000 mg | ORAL_TABLET | Freq: Once | ORAL | Status: DC | PRN

## 2022-09-21 SURGICAL SUPPLY — 22 items
BAG DRAIN URO-CYSTO SKYTR STRL (DRAIN) ×1 IMPLANT
BAG DRN UROCATH (DRAIN) ×1
BALLN NEPHROSTOMY (BALLOONS)
BALLN OPTILUME DCB 30X3X75 (BALLOONS)
BALLN OPTILUME DCB 30X5X75 (BALLOONS)
BALLOON NEPHROSTOMY (BALLOONS) IMPLANT
BALLOON OPTILUME DCB 30X3X75 (BALLOONS) IMPLANT
BALLOON OPTILUME DCB 30X5X75 (BALLOONS) IMPLANT
CLOTH BEACON ORANGE TIMEOUT ST (SAFETY) ×1 IMPLANT
ELECT REM PT RETURN 9FT ADLT (ELECTROSURGICAL)
ELECTRODE REM PT RTRN 9FT ADLT (ELECTROSURGICAL) ×1 IMPLANT
GLOVE BIO SURGEON STRL SZ 6.5 (GLOVE) ×1 IMPLANT
GLOVE BIOGEL PI IND STRL 6 (GLOVE) IMPLANT
GOWN STRL REUS W/TWL LRG LVL3 (GOWN DISPOSABLE) ×1 IMPLANT
GUIDEWIRE STR DUAL SENSOR (WIRE) IMPLANT
KIT TURNOVER CYSTO (KITS) ×1 IMPLANT
MANIFOLD NEPTUNE II (INSTRUMENTS) ×1 IMPLANT
PACK CYSTO (CUSTOM PROCEDURE TRAY) ×1 IMPLANT
SLEEVE SCD COMPRESS KNEE MED (STOCKING) ×1 IMPLANT
TUBE CONNECTING 12X1/4 (SUCTIONS) ×1 IMPLANT
TUBING UROLOGY SET (TUBING) ×1 IMPLANT
WATER STERILE IRR 3000ML UROMA (IV SOLUTION) ×1 IMPLANT

## 2022-09-21 NOTE — Interval H&P Note (Signed)
History and Physical Interval Note:  09/21/2022 12:50 PM  Yvette Caldwell  has presented today for surgery, with the diagnosis of URETHRAL STRICTURE.  The various methods of treatment have been discussed with the patient and family. After consideration of risks, benefits and other options for treatment, the patient has consented to  Procedure(s): CYSTOSCOPY WITH URETHRAL DILATATION (N/A) as a surgical intervention.  The patient's history has been reviewed, patient examined, no change in status, stable for surgery.  I have reviewed the patient's chart and labs.  Questions were answered to the patient's satisfaction.     Aleem Elza D Oma Alpert

## 2022-09-21 NOTE — Anesthesia Postprocedure Evaluation (Signed)
Anesthesia Post Note  Patient: Cheris Lammert  Procedure(s) Performed: CYSTOSCOPY WITH URETHRAL DILATATION (Urethra)     Patient location during evaluation: PACU Anesthesia Type: MAC Level of consciousness: awake Pain management: pain level controlled Vital Signs Assessment: post-procedure vital signs reviewed and stable Respiratory status: spontaneous breathing, nonlabored ventilation and respiratory function stable Cardiovascular status: blood pressure returned to baseline and stable Postop Assessment: no apparent nausea or vomiting Anesthetic complications: no   No notable events documented.  Last Vitals:  Vitals:   09/21/22 1400 09/21/22 1420  BP: 110/72 119/69  Pulse: 73 70  Resp: 14 16  Temp:  36.7 C  SpO2: 100% 100%    Last Pain:  Vitals:   09/21/22 1400  TempSrc:   PainSc: 0-No pain                 Tamiko Leopard P Glenora Morocho

## 2022-09-21 NOTE — Discharge Instructions (Addendum)
  Post Anesthesia Home Care Instructions  Activity: Get plenty of rest for the remainder of the day. A responsible individual must stay with you for 24 hours following the procedure.  For the next 24 hours, DO NOT: -Drive a car -Paediatric nurse -Drink alcoholic beverages -Take any medication unless instructed by your physician -Make any legal decisions or sign important papers.  Meals: Start with liquid foods such as gelatin or soup. Progress to regular foods as tolerated. Avoid greasy, spicy, heavy foods. If nausea and/or vomiting occur, drink only clear liquids until the nausea and/or vomiting subsides. Call your physician if vomiting continues.  Special Instructions/Symptoms: Your throat may feel dry or sore from the anesthesia or the breathing tube placed in your throat during surgery. If this causes discomfort, gargle with warm salt water. The discomfort should disappear within 24 hours.    Cystoscopy patient instructions  Following a cystoscopy, a catheter (a flexible rubber tube) is sometimes left in place to empty the bladder. This may cause some discomfort or a feeling that you need to urinate. Your doctor determines the period of time that the catheter will be left in place. You may have bloody urine for two to three days (Call your doctor if the amount of bleeding increases or does not subside).  You may pass blood clots in your urine, especially if you had a biopsy. It is not unusual to pass small blood clots and have some bloody urine a couple of weeks after your cystoscopy. Again, call your doctor if the bleeding does not subside. You may have: Dysuria (painful urination) Frequency (urinating often) Urgency (strong desire to urinate)  These symptoms are common especially if medicine is instilled into the bladder or a ureteral stent is placed. Avoiding alcohol and caffeine, such as coffee, tea, and chocolate, may help relieve these symptoms. Drink plenty of water, unless  otherwise instructed. Your doctor may also prescribe an antibiotic or other medicine to reduce these symptoms.  Cystoscopy results are available soon after the procedure; biopsy results usually take two to four days. Your doctor will discuss the results of your exam with you. Before you go home, you will be given specific instructions for follow-up care. Special Instructions:   If you are going home with a catheter in place do not take a tub bath until removed by your doctor.   You may resume your normal activities.   Do not drive or operate machinery if you are taking narcotic pain medicine.   Be sure to keep all follow-up appointments with your doctor.   Call Your Doctor If: The catheter is not draining You have severe pain You are unable to urinate You have a fever over 101 You have severe bleeding          You received Tylenol 1,000 mg at approximately 1 pm; next available dose can be taken after 7 pm 2/20. Total daily limit of tylenol is 4,000 mg.

## 2022-09-21 NOTE — Anesthesia Preprocedure Evaluation (Addendum)
Anesthesia Evaluation  Patient identified by MRN, date of birth, ID band Patient awake    Reviewed: Allergy & Precautions, NPO status , Patient's Chart, lab work & pertinent test results  Airway Mallampati: III  TM Distance: >3 FB Neck ROM: Full    Dental no notable dental hx.    Pulmonary sleep apnea    Pulmonary exam normal        Cardiovascular negative cardio ROS Normal cardiovascular exam     Neuro/Psych  PSYCHIATRIC DISORDERS Anxiety Depression    PTSD (post-traumatic stress disorder)    GI/Hepatic negative GI ROS, Neg liver ROS,,,  Endo/Other  negative endocrine ROS    Renal/GU negative Renal ROS     Musculoskeletal negative musculoskeletal ROS (+)    Abdominal   Peds  Hematology negative hematology ROS (+)   Anesthesia Other Findings URETHRAL STRICTURE  Reproductive/Obstetrics Hcg negative                             Anesthesia Physical Anesthesia Plan  ASA: 2  Anesthesia Plan: MAC   Post-op Pain Management:    Induction: Intravenous  PONV Risk Score and Plan: 3 and Ondansetron, Dexamethasone, Propofol infusion, Midazolam and Treatment may vary due to age or medical condition  Airway Management Planned: Simple Face Mask  Additional Equipment:   Intra-op Plan:   Post-operative Plan:   Informed Consent: I have reviewed the patients History and Physical, chart, labs and discussed the procedure including the risks, benefits and alternatives for the proposed anesthesia with the patient or authorized representative who has indicated his/her understanding and acceptance.     Dental advisory given  Plan Discussed with: CRNA  Anesthesia Plan Comments:        Anesthesia Quick Evaluation

## 2022-09-21 NOTE — Op Note (Signed)
Operative Note  Preoperative diagnosis:  1.  Urachal remnant 2.  Urethral stricture  Postoperative diagnosis: 1.  No obvious urachal remnant 2.  Urethral stricture  Procedure(s): 1.  Cystoscopy with urethral dilation 14 [place] to 24 [place]  Surgeon: Jacalyn Lefevre, MD  Assistants:  None  Anesthesia:  MAC  Complications:  None  EBL: None  Specimens: 1. none  Drains/Catheters: 1.  none  Intraoperative findings:   Urethral stricture dilated from 14Fr to 24Fr Cystoscopic evaluation of urethra normal after dilation Bilateral orthotopic Uos effluxing clear urine Small circular area at dome 5 mm slightly hyperpigmented with not obvious opening  Indication:  Yvette Caldwell is a 22 y.o. female with who initially presented with LUQ pain abdominal pain and urachal remnant.  Pt unable to have office cystoscopy due to urethral narrowing.  Description of procedure:  After risks and benefits of the procedure were discussed with the patient, informed consent was obtained.  The patient taken the operating placed in the supine position.  Anesthesia was induced and antibiotics were administered.  Patient was repositioned in the dorsolithotomy position.  She was prepped and draped in usual sterile fashion and a time was performed.  Patient's urethra was dilated with female sounds from 46 [place] to 24 [place].  Placing the 14 French urethral sound did meet mild resistance however with gentle pressure with was able to push past this.  Next a 83 French rigid cystoscope was placed in lithotomy meatus and advanced in the bladder and direct visualization.  Diagnostic cystoscopy took place.  Findings noted above.  Patient had no significant abnormality.  There was a small circular area on the posterior superior wall that could possibly area of a urachal remnant however there was no clear opening.  The patient's bladder was decompressed and the cystoscope was removed.  She emerged from anesthesia and  sent to the PACU in stable condition.  Plan: Follow-up in the office in 2 weeks

## 2022-09-21 NOTE — Anesthesia Procedure Notes (Signed)
Procedure Name: MAC Date/Time: 09/21/2022 1:02 PM  Performed by: Suan Halter, CRNAPre-anesthesia Checklist: Patient identified, Emergency Drugs available, Suction available, Patient being monitored and Timeout performed Oxygen Delivery Method: Simple face mask

## 2022-09-21 NOTE — Transfer of Care (Signed)
Immediate Anesthesia Transfer of Care Note  Patient: Yvette Caldwell  Procedure(s) Performed: Procedure(s) (LRB): CYSTOSCOPY WITH URETHRAL DILATATION (N/A)  Patient Location: PACU  Anesthesia Type: MAC  Level of Consciousness: awake, alert , oriented and patient cooperative  Airway & Oxygen Therapy: Patient Spontanous Breathing and Patient connected to face mask oxygen  Post-op Assessment: Report given to PACU RN and Post -op Vital signs reviewed and stable  Post vital signs: Reviewed and stable  Complications: No apparent anesthesia complications  Last Vitals:  Vitals Value Taken Time  BP    Temp    Pulse 94 09/21/22 1330  Resp 25 09/21/22 1330  SpO2 100 % 09/21/22 1330  Vitals shown include unvalidated device data.  Last Pain:  Vitals:   09/21/22 1158  TempSrc: Oral  PainSc: 0-No pain      Patients Stated Pain Goal: 5 ([id number])  Complications: No notable events documented.

## 2022-09-22 ENCOUNTER — Ambulatory Visit (INDEPENDENT_AMBULATORY_CARE_PROVIDER_SITE_OTHER): Payer: No Typology Code available for payment source

## 2022-09-22 ENCOUNTER — Encounter (HOSPITAL_BASED_OUTPATIENT_CLINIC_OR_DEPARTMENT_OTHER): Payer: Self-pay | Admitting: Urology

## 2022-09-22 DIAGNOSIS — J309 Allergic rhinitis, unspecified: Secondary | ICD-10-CM

## 2022-09-29 ENCOUNTER — Ambulatory Visit (INDEPENDENT_AMBULATORY_CARE_PROVIDER_SITE_OTHER): Payer: No Typology Code available for payment source

## 2022-09-29 DIAGNOSIS — J309 Allergic rhinitis, unspecified: Secondary | ICD-10-CM | POA: Diagnosis not present

## 2022-10-06 ENCOUNTER — Ambulatory Visit (INDEPENDENT_AMBULATORY_CARE_PROVIDER_SITE_OTHER): Payer: No Typology Code available for payment source

## 2022-10-06 DIAGNOSIS — J309 Allergic rhinitis, unspecified: Secondary | ICD-10-CM | POA: Diagnosis not present

## 2022-10-13 ENCOUNTER — Ambulatory Visit (INDEPENDENT_AMBULATORY_CARE_PROVIDER_SITE_OTHER): Payer: No Typology Code available for payment source

## 2022-10-13 DIAGNOSIS — J309 Allergic rhinitis, unspecified: Secondary | ICD-10-CM | POA: Diagnosis not present

## 2022-10-22 ENCOUNTER — Ambulatory Visit (INDEPENDENT_AMBULATORY_CARE_PROVIDER_SITE_OTHER): Payer: No Typology Code available for payment source

## 2022-10-22 DIAGNOSIS — J309 Allergic rhinitis, unspecified: Secondary | ICD-10-CM | POA: Diagnosis not present

## 2022-10-27 ENCOUNTER — Ambulatory Visit (INDEPENDENT_AMBULATORY_CARE_PROVIDER_SITE_OTHER): Payer: No Typology Code available for payment source

## 2022-10-27 DIAGNOSIS — J309 Allergic rhinitis, unspecified: Secondary | ICD-10-CM

## 2022-11-09 ENCOUNTER — Ambulatory Visit (INDEPENDENT_AMBULATORY_CARE_PROVIDER_SITE_OTHER): Payer: No Typology Code available for payment source

## 2022-11-09 DIAGNOSIS — J309 Allergic rhinitis, unspecified: Secondary | ICD-10-CM

## 2022-11-19 ENCOUNTER — Ambulatory Visit (INDEPENDENT_AMBULATORY_CARE_PROVIDER_SITE_OTHER): Payer: No Typology Code available for payment source

## 2022-11-19 DIAGNOSIS — J309 Allergic rhinitis, unspecified: Secondary | ICD-10-CM

## 2022-11-25 ENCOUNTER — Ambulatory Visit (INDEPENDENT_AMBULATORY_CARE_PROVIDER_SITE_OTHER): Payer: No Typology Code available for payment source

## 2022-11-25 DIAGNOSIS — J309 Allergic rhinitis, unspecified: Secondary | ICD-10-CM | POA: Diagnosis not present

## 2022-12-03 ENCOUNTER — Ambulatory Visit (INDEPENDENT_AMBULATORY_CARE_PROVIDER_SITE_OTHER): Payer: No Typology Code available for payment source

## 2022-12-03 DIAGNOSIS — J309 Allergic rhinitis, unspecified: Secondary | ICD-10-CM | POA: Diagnosis not present

## 2022-12-13 ENCOUNTER — Ambulatory Visit (INDEPENDENT_AMBULATORY_CARE_PROVIDER_SITE_OTHER): Payer: No Typology Code available for payment source | Admitting: *Deleted

## 2022-12-13 DIAGNOSIS — J309 Allergic rhinitis, unspecified: Secondary | ICD-10-CM

## 2022-12-28 ENCOUNTER — Ambulatory Visit (INDEPENDENT_AMBULATORY_CARE_PROVIDER_SITE_OTHER): Payer: No Typology Code available for payment source | Admitting: *Deleted

## 2022-12-28 DIAGNOSIS — J309 Allergic rhinitis, unspecified: Secondary | ICD-10-CM | POA: Diagnosis not present

## 2023-01-03 ENCOUNTER — Ambulatory Visit (INDEPENDENT_AMBULATORY_CARE_PROVIDER_SITE_OTHER): Payer: No Typology Code available for payment source | Admitting: *Deleted

## 2023-01-03 DIAGNOSIS — J309 Allergic rhinitis, unspecified: Secondary | ICD-10-CM

## 2023-01-12 ENCOUNTER — Ambulatory Visit (INDEPENDENT_AMBULATORY_CARE_PROVIDER_SITE_OTHER): Payer: No Typology Code available for payment source

## 2023-01-12 DIAGNOSIS — J309 Allergic rhinitis, unspecified: Secondary | ICD-10-CM | POA: Diagnosis not present

## 2023-01-20 ENCOUNTER — Ambulatory Visit (INDEPENDENT_AMBULATORY_CARE_PROVIDER_SITE_OTHER): Payer: No Typology Code available for payment source

## 2023-01-20 DIAGNOSIS — J309 Allergic rhinitis, unspecified: Secondary | ICD-10-CM | POA: Diagnosis not present

## 2023-01-27 ENCOUNTER — Ambulatory Visit (INDEPENDENT_AMBULATORY_CARE_PROVIDER_SITE_OTHER): Payer: No Typology Code available for payment source

## 2023-01-27 DIAGNOSIS — J309 Allergic rhinitis, unspecified: Secondary | ICD-10-CM | POA: Diagnosis not present

## 2023-02-02 ENCOUNTER — Ambulatory Visit (INDEPENDENT_AMBULATORY_CARE_PROVIDER_SITE_OTHER): Payer: No Typology Code available for payment source | Admitting: *Deleted

## 2023-02-02 DIAGNOSIS — J309 Allergic rhinitis, unspecified: Secondary | ICD-10-CM | POA: Diagnosis not present

## 2023-02-10 ENCOUNTER — Ambulatory Visit (INDEPENDENT_AMBULATORY_CARE_PROVIDER_SITE_OTHER): Payer: No Typology Code available for payment source | Admitting: *Deleted

## 2023-02-10 DIAGNOSIS — J309 Allergic rhinitis, unspecified: Secondary | ICD-10-CM

## 2023-02-16 ENCOUNTER — Ambulatory Visit: Payer: Self-pay | Admitting: *Deleted

## 2023-02-18 ENCOUNTER — Ambulatory Visit (INDEPENDENT_AMBULATORY_CARE_PROVIDER_SITE_OTHER): Payer: No Typology Code available for payment source

## 2023-02-18 DIAGNOSIS — J309 Allergic rhinitis, unspecified: Secondary | ICD-10-CM

## 2023-02-24 ENCOUNTER — Ambulatory Visit (INDEPENDENT_AMBULATORY_CARE_PROVIDER_SITE_OTHER): Payer: No Typology Code available for payment source

## 2023-02-24 DIAGNOSIS — J309 Allergic rhinitis, unspecified: Secondary | ICD-10-CM

## 2023-03-03 ENCOUNTER — Ambulatory Visit (INDEPENDENT_AMBULATORY_CARE_PROVIDER_SITE_OTHER): Payer: No Typology Code available for payment source

## 2023-03-03 DIAGNOSIS — J309 Allergic rhinitis, unspecified: Secondary | ICD-10-CM

## 2023-03-09 ENCOUNTER — Ambulatory Visit (INDEPENDENT_AMBULATORY_CARE_PROVIDER_SITE_OTHER): Payer: No Typology Code available for payment source

## 2023-03-09 DIAGNOSIS — J309 Allergic rhinitis, unspecified: Secondary | ICD-10-CM | POA: Diagnosis not present

## 2023-03-16 ENCOUNTER — Ambulatory Visit (INDEPENDENT_AMBULATORY_CARE_PROVIDER_SITE_OTHER): Payer: No Typology Code available for payment source | Admitting: *Deleted

## 2023-03-16 DIAGNOSIS — J309 Allergic rhinitis, unspecified: Secondary | ICD-10-CM

## 2023-03-22 ENCOUNTER — Ambulatory Visit (INDEPENDENT_AMBULATORY_CARE_PROVIDER_SITE_OTHER): Payer: No Typology Code available for payment source | Admitting: *Deleted

## 2023-03-22 DIAGNOSIS — J309 Allergic rhinitis, unspecified: Secondary | ICD-10-CM | POA: Diagnosis not present

## 2023-03-28 ENCOUNTER — Ambulatory Visit (INDEPENDENT_AMBULATORY_CARE_PROVIDER_SITE_OTHER): Payer: No Typology Code available for payment source | Admitting: *Deleted

## 2023-03-28 DIAGNOSIS — J309 Allergic rhinitis, unspecified: Secondary | ICD-10-CM

## 2023-04-06 ENCOUNTER — Telehealth: Payer: Self-pay | Admitting: Internal Medicine

## 2023-04-06 NOTE — Telephone Encounter (Signed)
Patient's current authorization, [id number] expires on 04-30-2023.   Faxed renewal of authorization request to [facility], [phone] and emailed it to [email or web address].    Called patient and advised of expiration date. Advised patient to reach out to her PCP at the [facility] or community care to get new consult started. Patient verbalized understanding.

## 2023-04-08 ENCOUNTER — Ambulatory Visit (INDEPENDENT_AMBULATORY_CARE_PROVIDER_SITE_OTHER): Payer: No Typology Code available for payment source

## 2023-04-08 DIAGNOSIS — J309 Allergic rhinitis, unspecified: Secondary | ICD-10-CM | POA: Diagnosis not present

## 2023-04-15 ENCOUNTER — Ambulatory Visit (INDEPENDENT_AMBULATORY_CARE_PROVIDER_SITE_OTHER): Payer: No Typology Code available for payment source

## 2023-04-15 DIAGNOSIS — J309 Allergic rhinitis, unspecified: Secondary | ICD-10-CM

## 2023-04-19 DIAGNOSIS — J3089 Other allergic rhinitis: Secondary | ICD-10-CM | POA: Diagnosis not present

## 2023-04-19 NOTE — Progress Notes (Signed)
VIAL EXP 04-18-24

## 2023-04-22 ENCOUNTER — Ambulatory Visit (INDEPENDENT_AMBULATORY_CARE_PROVIDER_SITE_OTHER): Payer: No Typology Code available for payment source

## 2023-04-22 DIAGNOSIS — J309 Allergic rhinitis, unspecified: Secondary | ICD-10-CM | POA: Diagnosis not present

## 2023-04-26 ENCOUNTER — Ambulatory Visit (INDEPENDENT_AMBULATORY_CARE_PROVIDER_SITE_OTHER): Payer: No Typology Code available for payment source

## 2023-04-26 DIAGNOSIS — J309 Allergic rhinitis, unspecified: Secondary | ICD-10-CM | POA: Diagnosis not present

## 2023-05-03 ENCOUNTER — Ambulatory Visit (INDEPENDENT_AMBULATORY_CARE_PROVIDER_SITE_OTHER): Payer: No Typology Code available for payment source | Admitting: *Deleted

## 2023-05-03 DIAGNOSIS — J309 Allergic rhinitis, unspecified: Secondary | ICD-10-CM

## 2023-05-10 ENCOUNTER — Ambulatory Visit (INDEPENDENT_AMBULATORY_CARE_PROVIDER_SITE_OTHER): Payer: No Typology Code available for payment source | Admitting: *Deleted

## 2023-05-10 DIAGNOSIS — J309 Allergic rhinitis, unspecified: Secondary | ICD-10-CM

## 2023-05-18 ENCOUNTER — Ambulatory Visit (INDEPENDENT_AMBULATORY_CARE_PROVIDER_SITE_OTHER): Payer: No Typology Code available for payment source | Admitting: *Deleted

## 2023-05-18 DIAGNOSIS — J309 Allergic rhinitis, unspecified: Secondary | ICD-10-CM

## 2023-05-24 ENCOUNTER — Ambulatory Visit (INDEPENDENT_AMBULATORY_CARE_PROVIDER_SITE_OTHER): Payer: No Typology Code available for payment source | Admitting: *Deleted

## 2023-05-24 DIAGNOSIS — J309 Allergic rhinitis, unspecified: Secondary | ICD-10-CM | POA: Diagnosis not present

## 2023-05-31 ENCOUNTER — Ambulatory Visit (INDEPENDENT_AMBULATORY_CARE_PROVIDER_SITE_OTHER): Payer: No Typology Code available for payment source | Admitting: *Deleted

## 2023-05-31 DIAGNOSIS — J309 Allergic rhinitis, unspecified: Secondary | ICD-10-CM | POA: Diagnosis not present

## 2023-06-07 ENCOUNTER — Ambulatory Visit (INDEPENDENT_AMBULATORY_CARE_PROVIDER_SITE_OTHER): Payer: No Typology Code available for payment source | Admitting: *Deleted

## 2023-06-07 DIAGNOSIS — J309 Allergic rhinitis, unspecified: Secondary | ICD-10-CM

## 2023-07-12 ENCOUNTER — Ambulatory Visit (INDEPENDENT_AMBULATORY_CARE_PROVIDER_SITE_OTHER): Payer: No Typology Code available for payment source | Admitting: Internal Medicine

## 2023-07-12 ENCOUNTER — Other Ambulatory Visit: Payer: Self-pay

## 2023-07-12 ENCOUNTER — Encounter: Payer: Self-pay | Admitting: Internal Medicine

## 2023-07-12 VITALS — BP 104/62 | HR 86 | Temp 98.4°F | Ht 68.0 in | Wt 184.9 lb

## 2023-07-12 DIAGNOSIS — J302 Other seasonal allergic rhinitis: Secondary | ICD-10-CM | POA: Diagnosis not present

## 2023-07-12 DIAGNOSIS — J3089 Other allergic rhinitis: Secondary | ICD-10-CM

## 2023-07-12 DIAGNOSIS — T781XXD Other adverse food reactions, not elsewhere classified, subsequent encounter: Secondary | ICD-10-CM | POA: Diagnosis not present

## 2023-07-12 DIAGNOSIS — J45991 Cough variant asthma: Secondary | ICD-10-CM

## 2023-07-12 MED ORDER — EPINEPHRINE 0.3 MG/0.3ML IJ SOAJ: 0.3000 mg | INTRAMUSCULAR | 1 refills | Status: AC | PRN

## 2023-07-12 MED ORDER — FLUTICASONE PROPIONATE 50 MCG/ACT NA SUSP: 2.0000 | Freq: Every day | NASAL | 11 refills | Status: DC

## 2023-07-12 MED ORDER — AZELASTINE HCL 0.1 % NA SOLN: 2.0000 | Freq: Two times a day (BID) | NASAL | 11 refills | Status: DC | PRN

## 2023-07-12 MED ORDER — FEXOFENADINE HCL 180 MG PO TABS: 180.0000 mg | ORAL_TABLET | Freq: Every day | ORAL | 11 refills | Status: DC

## 2023-07-12 NOTE — Patient Instructions (Addendum)
Allergic Rhinitis Allergic Conjunctivitis - SPT 04/2022: positive to grasses, trees, dust mites, cats  - Avoidance measures discussed. - Use nasal saline rinses before nose sprays such as with Neilmed Sinus Rinse.  Use distilled water.   - Use Flonase 2 sprays each nostril daily. Aim upward and outward. - Use Azelastine 2 sprays each nostril twice daily as needed for congestion, drainage, sneezing, runny nose. Aim upward and outward. - Use Allegra 180mg  daily.  - Continue allergen immunotherapy on schedule. Remember to come once a week and always bring your Epipen with you for the shots.  Initiated 05/2022.  Reached red vial 01/2023.   Pollen Food Allergy Syndrome- Banana/Pears - These symptoms are typically not life-threatening and are because of a cross reaction between a pollen you are allergic to, and to a protein in specific foods (such as fresh fruits, vegetables, and nuts). - If you can eat these things and tolerate the symptoms, it is fine to continue to do so.  If not, you may avoid these fresh fruits and vegetables.   - Heating these foods, buying them canned, and peeling these foods should allow them to be consumed without symptoms or with less symptoms.  Cough/Shortness of breath: - Continue follow up at the [place]. - Continue Advair 115-41mcg 2 puffs twice daily. - Rescue inhaler: Albuterol 2 puffs via spacer every 4-6 hours as needed for respiratory symptoms of cough, shortness of breath, or wheezing

## 2023-07-12 NOTE — Progress Notes (Signed)
FOLLOW UP Date of Service/Encounter:  07/12/23   Subjective:  Yvette Caldwell (DOB: 2001/05/25) is a 22 y.o. female [facility] for follow up for allergic rhinitis, PFAS, also with cough variant asthma- [facility]   History obtained from: chart review and patient. Last visit was on 07/30/2022; at the time, on Flonase/Azelastine/Allegra, discussed continuation of AIT buildup.  Also on Advair through [place] for asthma.  Since last visit, reports having some morning time cough and nighttime congestion but overall doing better than initial visit.  Using Flonase, Azelastine, Allegra daily.  Not much post nasal drip/runny nose.   Still using Advair BID, rarely needs Albuterol No ER visits/oral prednisone use. On AIT, spaced to monthly.  Has an Epipen. No reactions.    Past Medical History: Past Medical History:  Diagnosis Date   Allergic rhinitis    Depression    Eczema    Family history of breast cancer    Family history of breast cancer    Family history of colon cancer    Insomnia    PTSD (post-traumatic stress disorder)    Sleep apnea    Stress fracture     Objective:  BP 104/62 (BP Location: Left Arm, Patient Position: Sitting, Cuff Size: Normal)   Pulse 86   Temp 98.4 F (36.9 C)   Ht 5\' 8"  (1.727 m)   Wt 184 lb 14.4 oz (83.9 kg)   SpO2 97%   BMI 28.11 kg/m  Body mass index is 28.11 kg/m. Physical Exam: GEN: alert, well developed HEENT: clear conjunctiva, nose with mild inferior turbinate hypertrophy, pink nasal mucosa, + clear rhinorrhea, slight cobblestoning HEART: regular rate and rhythm, no murmur LUNGS: clear to auscultation bilaterally, no coughing, unlabored respiration SKIN: no rashes or lesions  Spirometry:  Tracings reviewed. Her effort: Good reproducible efforts. FVC: 3.44L, 92% predicted  FEV1: 3.01L, 92% predicted FEV1/FVC ratio: 88% Interpretation: Spirometry consistent with normal pattern.  Please  see scanned spirometry results for details.  Assessment:   1. Seasonal and perennial allergic rhinitis   2. Cough variant asthma   3. Pollen-food allergy, subsequent encounter     Plan/Recommendations:  Allergic Rhinitis Allergic Conjunctivitis - Still symptomatic, discussed continuation of AIT.  - SPT 04/2022: positive to grasses, trees, dust mites, cats  - Avoidance measures discussed. - Use nasal saline rinses before nose sprays such as with Neilmed Sinus Rinse.  Use distilled water.   - Use Flonase 2 sprays each nostril daily. Aim upward and outward. - Use Azelastine 2 sprays each nostril twice daily as needed for congestion, drainage, sneezing, runny nose. Aim upward and outward. - Use Allegra 180mg  daily.  - Continue allergen immunotherapy on schedule. Remember to come once a week and always bring your Epipen with you for the shots.  Initiated 05/2022.  Reached red vial 01/2023.   Pollen Food Allergy Syndrome- Banana/Pears - These symptoms are typically not life-threatening and are because of a cross reaction between a pollen you are allergic to, and to a protein in specific foods (such as fresh fruits, vegetables, and nuts). - If you can eat these things and tolerate the symptoms, it is fine to continue to do so.  If not, you may avoid these fresh fruits and vegetables.   - Heating these foods, buying them canned, and peeling these foods should allow them to be consumed without symptoms or with less symptoms.  Cough/Shortness of breath: - Continue follow up at the [place].Normal  spirometry today.  - Continue Advair 115-92mcg 2 puffs twice daily. - Rescue inhaler: Albuterol 2 puffs via spacer every 4-6 hours as needed for respiratory symptoms of cough, shortness of breath, or wheezing     Return in about 1 year (around 07/11/2024).  Alesia Morin, MD [facility]

## 2023-08-09 ENCOUNTER — Ambulatory Visit (INDEPENDENT_AMBULATORY_CARE_PROVIDER_SITE_OTHER): Payer: No Typology Code available for payment source | Admitting: *Deleted

## 2023-08-09 DIAGNOSIS — J309 Allergic rhinitis, unspecified: Secondary | ICD-10-CM

## 2023-09-20 ENCOUNTER — Ambulatory Visit (INDEPENDENT_AMBULATORY_CARE_PROVIDER_SITE_OTHER): Payer: No Typology Code available for payment source | Admitting: *Deleted

## 2023-09-20 DIAGNOSIS — J309 Allergic rhinitis, unspecified: Secondary | ICD-10-CM | POA: Diagnosis not present

## 2023-10-27 ENCOUNTER — Ambulatory Visit (INDEPENDENT_AMBULATORY_CARE_PROVIDER_SITE_OTHER)

## 2023-10-27 DIAGNOSIS — J309 Allergic rhinitis, unspecified: Secondary | ICD-10-CM | POA: Diagnosis not present

## 2023-11-01 DIAGNOSIS — J3089 Other allergic rhinitis: Secondary | ICD-10-CM | POA: Diagnosis not present

## 2023-11-01 NOTE — Progress Notes (Signed)
 VIAL MADE 10-31-24

## 2023-11-22 ENCOUNTER — Ambulatory Visit (INDEPENDENT_AMBULATORY_CARE_PROVIDER_SITE_OTHER)

## 2023-11-22 DIAGNOSIS — J309 Allergic rhinitis, unspecified: Secondary | ICD-10-CM

## 2023-12-06 ENCOUNTER — Ambulatory Visit (INDEPENDENT_AMBULATORY_CARE_PROVIDER_SITE_OTHER)

## 2023-12-06 DIAGNOSIS — J309 Allergic rhinitis, unspecified: Secondary | ICD-10-CM

## 2024-01-11 ENCOUNTER — Ambulatory Visit (INDEPENDENT_AMBULATORY_CARE_PROVIDER_SITE_OTHER): Payer: Self-pay

## 2024-01-11 DIAGNOSIS — J309 Allergic rhinitis, unspecified: Secondary | ICD-10-CM

## 2024-02-10 ENCOUNTER — Ambulatory Visit (INDEPENDENT_AMBULATORY_CARE_PROVIDER_SITE_OTHER)

## 2024-02-10 DIAGNOSIS — J309 Allergic rhinitis, unspecified: Secondary | ICD-10-CM

## 2024-03-20 ENCOUNTER — Ambulatory Visit (INDEPENDENT_AMBULATORY_CARE_PROVIDER_SITE_OTHER)

## 2024-03-20 DIAGNOSIS — J309 Allergic rhinitis, unspecified: Secondary | ICD-10-CM

## 2024-04-09 ENCOUNTER — Ambulatory Visit (INDEPENDENT_AMBULATORY_CARE_PROVIDER_SITE_OTHER)

## 2024-04-09 DIAGNOSIS — J309 Allergic rhinitis, unspecified: Secondary | ICD-10-CM | POA: Diagnosis not present

## 2024-04-11 ENCOUNTER — Telehealth: Payer: Self-pay | Admitting: Internal Medicine

## 2024-04-11 NOTE — Telephone Encounter (Signed)
 No answer to inform pt of current [facility] Authorization expiring on 05/02/2024

## 2024-05-09 ENCOUNTER — Ambulatory Visit

## 2024-05-09 DIAGNOSIS — J309 Allergic rhinitis, unspecified: Secondary | ICD-10-CM | POA: Diagnosis not present

## 2024-05-16 ENCOUNTER — Ambulatory Visit: Admitting: Internal Medicine

## 2024-05-16 ENCOUNTER — Other Ambulatory Visit: Payer: Self-pay

## 2024-05-16 ENCOUNTER — Encounter: Payer: Self-pay | Admitting: Internal Medicine

## 2024-05-16 VITALS — BP 102/66 | HR 90 | Temp 98.2°F | Ht 67.32 in | Wt 189.6 lb

## 2024-05-16 DIAGNOSIS — T7819XD Other adverse food reactions, not elsewhere classified, subsequent encounter: Secondary | ICD-10-CM | POA: Diagnosis not present

## 2024-05-16 DIAGNOSIS — J302 Other seasonal allergic rhinitis: Secondary | ICD-10-CM

## 2024-05-16 DIAGNOSIS — J45991 Cough variant asthma: Secondary | ICD-10-CM

## 2024-05-16 DIAGNOSIS — R21 Rash and other nonspecific skin eruption: Secondary | ICD-10-CM

## 2024-05-16 DIAGNOSIS — J3089 Other allergic rhinitis: Secondary | ICD-10-CM

## 2024-05-16 MED ORDER — FEXOFENADINE HCL 180 MG PO TABS: 180.0000 mg | ORAL_TABLET | Freq: Every day | ORAL | 5 refills | Status: AC

## 2024-05-16 MED ORDER — FLUTICASONE PROPIONATE 50 MCG/ACT NA SUSP: 2.0000 | Freq: Every day | NASAL | 5 refills | Status: AC

## 2024-05-16 MED ORDER — AZELASTINE HCL 0.1 % NA SOLN: 2.0000 | Freq: Two times a day (BID) | NASAL | 5 refills | Status: AC | PRN

## 2024-05-16 MED ORDER — ALBUTEROL SULFATE HFA 108 (90 BASE) MCG/ACT IN AERS: 1.0000 | INHALATION_SPRAY | RESPIRATORY_TRACT | 1 refills | Status: AC | PRN

## 2024-05-16 MED ORDER — FLUTICASONE-SALMETEROL 115-21 MCG/ACT IN AERO: 2.0000 | INHALATION_SPRAY | Freq: Two times a day (BID) | RESPIRATORY_TRACT | 5 refills | Status: AC

## 2024-05-16 NOTE — Addendum Note (Signed)
 Addended by: AZALEA, Charlis Harner on: 05/16/2024 11:24 AM   Modules accepted: Orders

## 2024-05-16 NOTE — Progress Notes (Signed)
 FOLLOW UP Date of Service/Encounter:  05/16/24   Subjective:  Yvette Caldwell (DOB: July 31, 2001) is a 23 y.o. female who [facility] for follow up for cough variant asthma asthma- at [facility] [address], allergic rhinitis, PFAS.   History obtained from: chart review and patient. Last seen 07/12/2023 with me and was doing better with rhinitis, using Flonase , Azelastine , Allegra  and on AIT.  Also on Advair, controlled.    Reports allergies are fine.  Does note doing better with AIT.  Still with some cough and post nasal drainage. Using Flonase  daily and Allegra  daily, not taking Azelastine  regularly.  Asthma is doing fine, only has some shortness of breath with heavy activity during hot weather.  Using Advair BID. Albuterol use is rare, less than 1x/week.  No ER/urgent care/oral prednisone since last visit.  Still avoids certain fruits like bananas.   Does note a new onset of rash, ongoing for a few months but worse the last few weeks.  It is all over her chest, neck, arms, legs.  Denies any recent viral infections/fevers.  She reports UTD on vaccines. Wonders if its eczema.    Past Medical History: Past Medical History:  Diagnosis Date   Allergic rhinitis    Depression    Eczema    Family history of breast cancer    Family history of breast cancer    Family history of colon cancer    Insomnia    PTSD (post-traumatic stress disorder)    Sleep apnea    Stress fracture     Objective:  BP 102/66 (BP Location: Left Arm, Patient Position: Sitting, Cuff Size: Normal)   Pulse 90   Temp 98.2 F (36.8 C) (Temporal)   Ht 5' 7.32 (1.71 m)   Wt 189 lb 9.6 oz (86 kg)   SpO2 97%   BMI 29.41 kg/m  Body mass index is 29.41 kg/m. Physical Exam: GEN: alert, well developed HEENT: clear conjunctiva, nose with mild inferior turbinate hypertrophy, pink nasal mucosa, slight clear rhinorrhea, no cobblestoning HEART: regular rate and rhythm, no murmur LUNGS: clear  to auscultation bilaterally, no coughing, unlabored respiration SKIN: no rashes or lesions  Spirometry:  Tracings reviewed. Her effort: Good reproducible efforts. FVC: 3.2L, 88% predicted  FEV1: 2.63L, 84% predicted FEV1/FVC ratio: 82% Interpretation: Spirometry consistent with normal pattern.  Please see scanned spirometry results for details.  Assessment:   1. Seasonal and perennial allergic rhinitis   2. Pollen-food allergy , subsequent encounter   3. Cough variant asthma   4. Rash and nonspecific skin eruption     Plan/Recommendations:   Allergic Rhinoconjunctivitis:  - Improved, continue AIT.  - SPT 04/2022: positive to grasses, trees, dust mites, cats  - Use nasal saline rinses before nose sprays such as with Neilmed Sinus Rinse.  Use distilled water .   - Use Flonase  2 sprays each nostril daily. Aim upward and outward. - Use Azelastine  2 sprays each nostril twice daily as needed for congestion, drainage, sneezing, runny nose. Aim upward and outward. - Use Allegra  180mg  daily.  - Continue allergen immunotherapy on schedule. Remember to come once a week and always bring your Epipen  with you for the shots.  Initiated 05/2022.  Reached red vial 01/2023.   Pollen Food Allergy  Syndrome- Banana/Pears - These symptoms are typically not life-threatening and are because of a cross reaction between a pollen you are allergic to, and to a protein in specific foods (such as fresh fruits, vegetables, and nuts). - If  you can eat these things and tolerate the symptoms, it is fine to continue to do so.  If not, you may avoid these fresh fruits and vegetables.   - Heating these foods, buying them canned, and peeling these foods should allow them to be consumed without symptoms or with less symptoms.  Cough Variant Asthma: - Controlled with normal spirometry today, MDI technique discussed.  - Continue follow up at the [place]. - Continue Advair 115-21mcg 2 puffs twice daily.  - Rescue inhaler:  Albuterol 2 puffs via spacer every 4-6 hours as needed for respiratory symptoms of cough, shortness of breath, or wheezing  Rash - Not consistent with eczema, possibly viral.   - Follow up with PCP and consider seeing Dermatology.     Return in about 6 months (around 11/14/2024).  Arleta Blanch, MD [facility] 

## 2024-05-16 NOTE — Patient Instructions (Addendum)
 Allergic Rhinoconjunctivitis:  - SPT 04/2022: positive to grasses, trees, dust mites, cats  - Use nasal saline rinses before nose sprays such as with Neilmed Sinus Rinse.  Use distilled water .   - Use Flonase  2 sprays each nostril daily. Aim upward and outward. - Use Azelastine  2 sprays each nostril twice daily as needed for congestion, drainage, sneezing, runny nose. Aim upward and outward. - Use Allegra  180mg  daily.  - Continue allergen immunotherapy on schedule. Remember to come once a week and always bring your Epipen  with you for the shots.  Initiated 05/2022.  Reached red vial 01/2023.   Pollen Food Allergy  Syndrome- Banana/Pears - These symptoms are typically not life-threatening and are because of a cross reaction between a pollen you are allergic to, and to a protein in specific foods (such as fresh fruits, vegetables, and nuts). - If you can eat these things and tolerate the symptoms, it is fine to continue to do so.  If not, you may avoid these fresh fruits and vegetables.   - Heating these foods, buying them canned, and peeling these foods should allow them to be consumed without symptoms or with less symptoms.  Cough Variant Asthma: - Continue follow up at the [place]. - Continue Advair 115-21mcg 2 puffs twice daily.  - Rescue inhaler: Albuterol 2 puffs via spacer every 4-6 hours as needed for respiratory symptoms of cough, shortness of breath, or wheezing  Rash - Not consistent with eczema, possibly viral.   - Follow up with PCP and consider seeing Dermatology.

## 2024-06-14 ENCOUNTER — Ambulatory Visit (INDEPENDENT_AMBULATORY_CARE_PROVIDER_SITE_OTHER)

## 2024-06-14 DIAGNOSIS — J3089 Other allergic rhinitis: Secondary | ICD-10-CM | POA: Diagnosis not present

## 2024-06-14 DIAGNOSIS — J302 Other seasonal allergic rhinitis: Secondary | ICD-10-CM

## 2024-06-25 ENCOUNTER — Emergency Department (HOSPITAL_COMMUNITY)

## 2024-06-25 ENCOUNTER — Emergency Department (HOSPITAL_COMMUNITY)
Admission: EM | Admit: 2024-06-25 | Discharge: 2024-06-25 | Disposition: A | Discharge status: Home / Self Care | Attending: Emergency Medicine | Admitting: Emergency Medicine

## 2024-06-25 ENCOUNTER — Encounter (HOSPITAL_COMMUNITY): Payer: Self-pay

## 2024-06-25 DIAGNOSIS — R109 Unspecified abdominal pain: Secondary | ICD-10-CM | POA: Insufficient documentation

## 2024-06-25 DIAGNOSIS — M25511 Pain in right shoulder: Secondary | ICD-10-CM | POA: Insufficient documentation

## 2024-06-25 DIAGNOSIS — M545 Low back pain, unspecified: Secondary | ICD-10-CM | POA: Insufficient documentation

## 2024-06-25 DIAGNOSIS — R0789 Other chest pain: Secondary | ICD-10-CM | POA: Insufficient documentation

## 2024-06-25 DIAGNOSIS — M25561 Pain in right knee: Secondary | ICD-10-CM | POA: Insufficient documentation

## 2024-06-25 DIAGNOSIS — Y92410 Unspecified street and highway as the place of occurrence of the external cause: Secondary | ICD-10-CM | POA: Insufficient documentation

## 2024-06-25 DIAGNOSIS — M25562 Pain in left knee: Secondary | ICD-10-CM | POA: Insufficient documentation

## 2024-06-25 LAB — I-STAT CHEM 8, ED
BUN: 8 mg/dL (ref 6–20)
Calcium, Ion: 1.14 mmol/L — ABNORMAL LOW (ref 1.15–1.40)
Chloride: 102 mmol/L (ref 98–111)
Creatinine, Ser: 1 mg/dL (ref 0.44–1.00)
Glucose, Bld: 81 mg/dL (ref 70–99)
HCT: 38 % (ref 36.0–46.0)
Hemoglobin: 12.9 g/dL (ref 12.0–15.0)
Potassium: 3.8 mmol/L (ref 3.5–5.1)
Sodium: 140 mmol/L (ref 135–145)
TCO2: 24 mmol/L (ref 22–32)

## 2024-06-25 LAB — POC URINE PREG, ED: Preg Test, Ur: NEGATIVE

## 2024-06-25 MED ORDER — IBUPROFEN 800 MG PO TABS
800.0000 mg | ORAL_TABLET | Freq: Once | ORAL | Status: AC
  Administered 2024-06-25: 800 mg via ORAL
  Filled 2024-06-25: qty 1

## 2024-06-25 MED ORDER — ACETAMINOPHEN 500 MG PO TABS
1000.0000 mg | ORAL_TABLET | Freq: Once | ORAL | Status: AC
  Administered 2024-06-25: 1000 mg via ORAL
  Filled 2024-06-25: qty 2

## 2024-06-25 MED ORDER — IOHEXOL 350 MG/ML SOLN
75.0000 mL | Freq: Once | INTRAVENOUS | Status: AC | PRN
  Administered 2024-06-25: 75 mL via INTRAVENOUS

## 2024-06-25 NOTE — ED Notes (Signed)
 CT called

## 2024-06-25 NOTE — ED Notes (Signed)
 CT called, patient to be picked up for scan soon
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
## 2024-06-25 NOTE — ED Notes (Signed)
 Patient transported to CT

## 2024-06-25 NOTE — Discharge Instructions (Signed)
 I discussed the plan for discharge with the patient and/or their surrogate at bedside prior to discharge and they were in agreement with the plan and verbalized understanding of the return precautions provided. All questions answered to the best of my ability. Ultimately, the patient was discharged in stable condition with stable vital signs. I am reassured that they are capable of close follow up and good social support at home.
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
## 2024-07-12 ENCOUNTER — Ambulatory Visit (INDEPENDENT_AMBULATORY_CARE_PROVIDER_SITE_OTHER)

## 2024-07-12 DIAGNOSIS — J302 Other seasonal allergic rhinitis: Secondary | ICD-10-CM

## 2024-07-12 DIAGNOSIS — J3089 Other allergic rhinitis: Secondary | ICD-10-CM

## 2024-07-31 NOTE — Telephone Encounter (Signed)
 Pt new [facility] referral has been added to chart

## 2024-08-08 ENCOUNTER — Ambulatory Visit

## 2024-08-08 DIAGNOSIS — J302 Other seasonal allergic rhinitis: Secondary | ICD-10-CM

## 2024-08-23 ENCOUNTER — Ambulatory Visit

## 2024-08-23 DIAGNOSIS — J302 Other seasonal allergic rhinitis: Secondary | ICD-10-CM | POA: Diagnosis not present

## 2024-08-28 NOTE — Progress Notes (Signed)
 VIAL MADE ON 08/28/24

## 2024-11-14 ENCOUNTER — Ambulatory Visit: Admitting: Internal Medicine
